# Patient Record
Sex: Female | Born: 1945 | Race: Black or African American | Hispanic: No | Marital: Single | Smoking: Never smoker
Health system: Southern US, Community
[De-identification: ages and names within clinical notes are randomized; demographics above are authoritative.]

## PROBLEM LIST (undated history)

## (undated) DIAGNOSIS — R7303 Prediabetes: Secondary | ICD-10-CM

## (undated) DIAGNOSIS — S43439A Superior glenoid labrum lesion of unspecified shoulder, initial encounter: Secondary | ICD-10-CM

## (undated) DIAGNOSIS — R42 Dizziness and giddiness: Secondary | ICD-10-CM

## (undated) DIAGNOSIS — M51369 Other intervertebral disc degeneration, lumbar region without mention of lumbar back pain or lower extremity pain: Secondary | ICD-10-CM

## (undated) DIAGNOSIS — I10 Essential (primary) hypertension: Secondary | ICD-10-CM

## (undated) DIAGNOSIS — K219 Gastro-esophageal reflux disease without esophagitis: Secondary | ICD-10-CM

## (undated) DIAGNOSIS — I5189 Other ill-defined heart diseases: Secondary | ICD-10-CM

## (undated) DIAGNOSIS — M7581 Other shoulder lesions, right shoulder: Secondary | ICD-10-CM

## (undated) DIAGNOSIS — M810 Age-related osteoporosis without current pathological fracture: Secondary | ICD-10-CM

## (undated) DIAGNOSIS — M5136 Other intervertebral disc degeneration, lumbar region: Secondary | ICD-10-CM

## (undated) DIAGNOSIS — M7521 Bicipital tendinitis, right shoulder: Secondary | ICD-10-CM

## (undated) DIAGNOSIS — H3500 Unspecified background retinopathy: Secondary | ICD-10-CM

## (undated) DIAGNOSIS — H409 Unspecified glaucoma: Secondary | ICD-10-CM

## (undated) DIAGNOSIS — E039 Hypothyroidism, unspecified: Secondary | ICD-10-CM

## (undated) DIAGNOSIS — G43909 Migraine, unspecified, not intractable, without status migrainosus: Secondary | ICD-10-CM

## (undated) DIAGNOSIS — E559 Vitamin D deficiency, unspecified: Secondary | ICD-10-CM

## (undated) DIAGNOSIS — E785 Hyperlipidemia, unspecified: Secondary | ICD-10-CM

## (undated) HISTORY — PX: ABDOMINAL HYSTERECTOMY: SHX81

## (undated) HISTORY — DX: Unspecified background retinopathy: H35.00

## (undated) HISTORY — DX: Gastro-esophageal reflux disease without esophagitis: K21.9

## (undated) HISTORY — DX: Bicipital tendinitis, right shoulder: M75.21

## (undated) HISTORY — DX: Age-related osteoporosis without current pathological fracture: M81.0

## (undated) HISTORY — DX: Other intervertebral disc degeneration, lumbar region: M51.36

## (undated) HISTORY — DX: Migraine, unspecified, not intractable, without status migrainosus: G43.909

## (undated) HISTORY — DX: Other ill-defined heart diseases: I51.89

## (undated) HISTORY — DX: Other intervertebral disc degeneration, lumbar region without mention of lumbar back pain or lower extremity pain: M51.369

## (undated) HISTORY — DX: Other shoulder lesions, right shoulder: M75.81

## (undated) HISTORY — DX: Hyperlipidemia, unspecified: E78.5

## (undated) HISTORY — DX: Prediabetes: R73.03

## (undated) HISTORY — DX: Vitamin D deficiency, unspecified: E55.9

## (undated) HISTORY — DX: Dizziness and giddiness: R42

## (undated) HISTORY — DX: Superior glenoid labrum lesion of unspecified shoulder, initial encounter: S43.439A

## (undated) HISTORY — DX: Unspecified glaucoma: H40.9

## (undated) HISTORY — DX: Hypothyroidism, unspecified: E03.9

## (undated) HISTORY — DX: Essential (primary) hypertension: I10

## (undated) HISTORY — PX: TONSILLECTOMY AND ADENOIDECTOMY: SHX28

---

## 1998-03-21 ENCOUNTER — Other Ambulatory Visit: Admission: RE | Admit: 1998-03-21 | Discharge: 1998-03-21 | Payer: Self-pay | Admitting: Obstetrics and Gynecology

## 1998-08-19 HISTORY — PX: THYROIDECTOMY, PARTIAL: SHX18

## 1999-04-10 ENCOUNTER — Other Ambulatory Visit: Admission: RE | Admit: 1999-04-10 | Discharge: 1999-04-10 | Payer: Self-pay | Admitting: Obstetrics and Gynecology

## 1999-05-25 ENCOUNTER — Encounter: Payer: Self-pay | Admitting: Surgery

## 1999-05-25 ENCOUNTER — Ambulatory Visit (HOSPITAL_COMMUNITY): Admission: RE | Admit: 1999-05-25 | Discharge: 1999-05-25 | Payer: Self-pay | Admitting: Surgery

## 1999-05-25 ENCOUNTER — Encounter (INDEPENDENT_AMBULATORY_CARE_PROVIDER_SITE_OTHER): Payer: Self-pay | Admitting: Specialist

## 2000-04-22 ENCOUNTER — Other Ambulatory Visit: Admission: RE | Admit: 2000-04-22 | Discharge: 2000-04-22 | Payer: Self-pay | Admitting: Obstetrics and Gynecology

## 2000-04-25 ENCOUNTER — Encounter: Payer: Self-pay | Admitting: Surgery

## 2000-04-25 ENCOUNTER — Encounter: Admission: RE | Admit: 2000-04-25 | Discharge: 2000-04-25 | Payer: Self-pay | Admitting: Surgery

## 2001-05-11 ENCOUNTER — Encounter: Admission: RE | Admit: 2001-05-11 | Discharge: 2001-05-11 | Payer: Self-pay | Admitting: Endocrinology

## 2001-05-11 ENCOUNTER — Encounter: Payer: Self-pay | Admitting: Endocrinology

## 2001-05-21 ENCOUNTER — Other Ambulatory Visit: Admission: RE | Admit: 2001-05-21 | Discharge: 2001-05-21 | Payer: Self-pay | Admitting: Obstetrics and Gynecology

## 2002-01-12 ENCOUNTER — Encounter: Payer: Self-pay | Admitting: Internal Medicine

## 2002-01-19 ENCOUNTER — Encounter: Admission: RE | Admit: 2002-01-19 | Discharge: 2002-01-19 | Payer: Self-pay | Admitting: Endocrinology

## 2002-01-19 ENCOUNTER — Encounter: Payer: Self-pay | Admitting: Endocrinology

## 2002-06-23 ENCOUNTER — Other Ambulatory Visit: Admission: RE | Admit: 2002-06-23 | Discharge: 2002-06-23 | Payer: Self-pay | Admitting: Obstetrics and Gynecology

## 2002-07-27 ENCOUNTER — Encounter: Admission: RE | Admit: 2002-07-27 | Discharge: 2002-10-25 | Payer: Self-pay | Admitting: Internal Medicine

## 2002-07-30 ENCOUNTER — Encounter: Payer: Self-pay | Admitting: Endocrinology

## 2002-07-30 ENCOUNTER — Encounter: Admission: RE | Admit: 2002-07-30 | Discharge: 2002-07-30 | Payer: Self-pay | Admitting: Endocrinology

## 2002-10-25 ENCOUNTER — Encounter: Admission: RE | Admit: 2002-10-25 | Discharge: 2003-01-23 | Payer: Self-pay | Admitting: Internal Medicine

## 2002-10-28 ENCOUNTER — Encounter: Payer: Self-pay | Admitting: Internal Medicine

## 2002-11-01 ENCOUNTER — Ambulatory Visit (HOSPITAL_COMMUNITY): Admission: RE | Admit: 2002-11-01 | Discharge: 2002-11-01 | Payer: Self-pay | Admitting: Internal Medicine

## 2002-11-01 ENCOUNTER — Encounter: Payer: Self-pay | Admitting: Internal Medicine

## 2003-02-01 ENCOUNTER — Encounter: Admission: RE | Admit: 2003-02-01 | Discharge: 2003-05-02 | Payer: Self-pay | Admitting: Internal Medicine

## 2003-04-15 ENCOUNTER — Encounter: Payer: Self-pay | Admitting: Endocrinology

## 2003-04-15 ENCOUNTER — Encounter: Admission: RE | Admit: 2003-04-15 | Discharge: 2003-04-15 | Payer: Self-pay | Admitting: Endocrinology

## 2003-07-21 ENCOUNTER — Other Ambulatory Visit: Admission: RE | Admit: 2003-07-21 | Discharge: 2003-07-21 | Payer: Self-pay | Admitting: Obstetrics and Gynecology

## 2003-11-07 ENCOUNTER — Encounter (INDEPENDENT_AMBULATORY_CARE_PROVIDER_SITE_OTHER): Payer: Self-pay | Admitting: *Deleted

## 2003-11-07 ENCOUNTER — Inpatient Hospital Stay (HOSPITAL_COMMUNITY): Admission: RE | Admit: 2003-11-07 | Discharge: 2003-11-08 | Payer: Self-pay | Admitting: Surgery

## 2004-09-11 ENCOUNTER — Encounter: Admission: RE | Admit: 2004-09-11 | Discharge: 2004-09-11 | Payer: Self-pay | Admitting: Otolaryngology

## 2004-09-24 ENCOUNTER — Ambulatory Visit: Payer: Self-pay | Admitting: Internal Medicine

## 2005-04-15 ENCOUNTER — Ambulatory Visit: Payer: Self-pay | Admitting: Internal Medicine

## 2005-07-10 ENCOUNTER — Encounter: Admission: RE | Admit: 2005-07-10 | Discharge: 2005-07-10 | Payer: Self-pay | Admitting: Internal Medicine

## 2008-03-18 ENCOUNTER — Telehealth: Payer: Self-pay | Admitting: Internal Medicine

## 2008-03-21 ENCOUNTER — Ambulatory Visit: Payer: Self-pay | Admitting: Gastroenterology

## 2008-03-21 DIAGNOSIS — J309 Allergic rhinitis, unspecified: Secondary | ICD-10-CM | POA: Insufficient documentation

## 2008-03-21 DIAGNOSIS — E039 Hypothyroidism, unspecified: Secondary | ICD-10-CM | POA: Insufficient documentation

## 2008-03-21 DIAGNOSIS — I10 Essential (primary) hypertension: Secondary | ICD-10-CM | POA: Insufficient documentation

## 2008-03-21 DIAGNOSIS — K219 Gastro-esophageal reflux disease without esophagitis: Secondary | ICD-10-CM | POA: Insufficient documentation

## 2008-03-21 DIAGNOSIS — K224 Dyskinesia of esophagus: Secondary | ICD-10-CM

## 2008-03-24 ENCOUNTER — Ambulatory Visit (HOSPITAL_COMMUNITY): Admission: RE | Admit: 2008-03-24 | Discharge: 2008-03-24 | Payer: Self-pay | Admitting: Internal Medicine

## 2009-10-24 ENCOUNTER — Ambulatory Visit (HOSPITAL_COMMUNITY): Admission: RE | Admit: 2009-10-24 | Discharge: 2009-10-24 | Payer: Self-pay | Admitting: Obstetrics and Gynecology

## 2010-02-28 ENCOUNTER — Encounter: Admission: RE | Admit: 2010-02-28 | Discharge: 2010-02-28 | Payer: Self-pay | Admitting: Orthopedic Surgery

## 2010-10-29 ENCOUNTER — Other Ambulatory Visit (HOSPITAL_COMMUNITY): Payer: Self-pay | Admitting: Obstetrics and Gynecology

## 2010-10-29 DIAGNOSIS — E041 Nontoxic single thyroid nodule: Secondary | ICD-10-CM

## 2010-10-31 ENCOUNTER — Other Ambulatory Visit (HOSPITAL_COMMUNITY): Payer: Self-pay

## 2010-11-07 ENCOUNTER — Ambulatory Visit (HOSPITAL_COMMUNITY)
Admission: RE | Admit: 2010-11-07 | Discharge: 2010-11-07 | Disposition: A | Discharge status: Home / Self Care | Payer: BC Managed Care – PPO | Source: Ambulatory Visit | Attending: Obstetrics and Gynecology | Admitting: Obstetrics and Gynecology

## 2010-11-07 DIAGNOSIS — E041 Nontoxic single thyroid nodule: Secondary | ICD-10-CM

## 2010-11-07 DIAGNOSIS — E0789 Other specified disorders of thyroid: Secondary | ICD-10-CM | POA: Insufficient documentation

## 2010-11-07 DIAGNOSIS — E042 Nontoxic multinodular goiter: Secondary | ICD-10-CM | POA: Insufficient documentation

## 2011-01-04 NOTE — Op Note (Signed)
Tammy Robertson, Tammy Robertson                          ACCOUNT NO.:  [id number]   MEDICAL RECORD NO.:  [id number]                   PATIENT TYPE:  INP   LOCATION:  2899                                 FACILITY:  [facility]   PHYSICIAN:  Sandria Bales. Ezzard Standing, M.D.               DATE OF BIRTH:  [birth year]   DATE OF PROCEDURE:  11/07/2003  DATE OF DISCHARGE:                                 OPERATIVE REPORT   PREOPERATIVE DIAGNOSIS:  Multinodular goiter with left lobe extending into  superior mediastinum.   POSTOPERATIVE DIAGNOSIS:  Multinodular goiter with the left lower lobe  extending into the anterior mediastinum and the left lobe actually two  separate lobes.   PROCEDURE:  Left thyroid lobectomy with removal of the extension of the  mediastinal thyroid.   SURGEON:  Sandria Bales. Ezzard Standing, M.D.   FIRST ASSISTANT:  Ines Bloomer, M.D.   ANESTHESIA:  General endotracheal.   ESTIMATED BLOOD LOSS:  50 mL.   DRAINS LEFT IN:  None.   INDICATION FOR PROCEDURE:  Tammy Robertson is a 65 year old black female who has  had a multinodular goiter for a number of years and had been on thyroid  suppression.  A recent CT scan suggested a superior anterior mediastinal  mass consistent with extension of the left thyroid gland.   The patient now comes for attempted left thyroid lobectomy and removal of  this mediastinal mass.  The patient understands first this mediastinal mass  could be something other than thyroid tissue; secondary, that if we cannot  get it out through a neck incision she may need a median sternotomy, and  therefore Ines Bloomer, M.D., has agreed to scrub with me.   The potential complications include but are not limited to bleeding,  infection, parathyroid injury, though I told her we would stay to just one  gland unless there is some evidence of malignancy, and recurrent laryngeal  nerve injury which the nerve may be distorted or splayed by her thyroid  gland.   The patient brought to the  operating room and underwent a general  endotracheal anesthesia supervised by Bedelia Person, M.D.  Her arms were tucked  to her sides.  She had a left arterial line and Foley catheter in place.  Her upper neck and chest were prepped with Betadine solution and sterilely  draped.   A transverse neck incision was made approximately 2 cm above the  suprasternal notch.  The skin flaps were elevated superiorly to the cricoid  cartilage and inferiorly to the superior end of the sternum.  I then divided  the strap muscles and started dissection of the left thyroid lobe.   The strap muscles were retracted laterally.  We tried to free up the thyroid  gland.  What was interesting is that it appeared that the left thyroid gland  was actually two separate lobes.  There was an upper lobe which extended  fairly far up the left neck; then there was a space of about 1 cm and then  the lower lobe, and this is what extended down into the mediastinum.   I was able to mobilize up the gland both by Bovie electrocautery, by blunt  dissection and finger dissection, to get this thyroid out of the mediastinum  and pull this up into the neck wound.   The inferior thyroid vessels were clipped with medium clips or tied with  silk suture.  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  It was being  splayed sort of lateral and posterior, and we went on and removed this lower  pole separately and actually, once we got it out of the superior portion of  the mediastinum, it actually came out fairly though it had a lot of  distorted adventitial tissue around it.   Again, the upper lobe appeared to be a separate pole.  This was much more  stuck to the strap muscles.  I went up to the upper pole vessels.  I both  tied them with a 2-0 silk suture and clipped them.  There were two branches.  I then retracted this down, and again it had a lot of attachments to the  trachea and tracheal groove and to the vessels.   Again, it  appeared the recurrent laryngeal nerve went posterior to these  attachments and I was able to rotate this thyroid gland medially and resect  it.  I did send it as a separate specimen.  The estimated size of the upper  thyroid was maybe 3 x 5 cm, of the lower thyroid was probably about 4 x 12  cm, and these were sent as separate specimens.  Again, I did not do a frozen  because they had a benign appearance.  It looked really like a multinodular  gland.  On the right thyroid gland she had one or two prominent nodules.  Again I think I did identify a superior left parathyroid gland but I never  saw an inferior parathyroid gland.  I did identify the recurrent laryngeal  nerve.  We irrigated the wound.  My primary method of ligating vessels was  either with small clips, medium clips, or silk ties.  Once this was  complete, we then irrigated out the wound.  I placed Surgicel up in the  upper pole bed and the lateral bed.  I then closed the strap muscles in  interrupted 3-0 Vicryl suture.  I closed the platysma with 3-0 Vicryl  sutures and closed the skin with a 5-0 Vicryl suture.   The patient tolerated the procedure well, was transported to the recovery  room in good condition.  Sponge and needle count were correct at the end of  the case.                                               Sandria Bales. Ezzard Standing, M.D.    DHN/MEDQ  D:  11/07/2003  T:  11/08/2003  Job:  [id number]   cc:   Merlene Laughter. Renae Gloss, M.D.  [address]  [place]  [place] [address]  Fax: [phone]   Ines Bloomer, M.D.  [address]  [place]  [place] [address]   Brooke Bonito, M.D.  [address]  [place]  [place] [address]  Fax:  [phone] 

## 2011-10-29 ENCOUNTER — Encounter: Payer: Self-pay | Admitting: Internal Medicine

## 2011-11-04 ENCOUNTER — Ambulatory Visit (INDEPENDENT_AMBULATORY_CARE_PROVIDER_SITE_OTHER): Payer: BC Managed Care – PPO | Admitting: Family Medicine

## 2011-11-04 VITALS — BP 134/73 | HR 99 | Temp 98.2°F | Resp 16 | Ht 63.0 in | Wt 151.0 lb

## 2011-11-04 DIAGNOSIS — H6120 Impacted cerumen, unspecified ear: Secondary | ICD-10-CM

## 2011-11-04 DIAGNOSIS — R112 Nausea with vomiting, unspecified: Secondary | ICD-10-CM

## 2011-11-04 LAB — POCT CBC
Granulocyte percent: 69.4 %G (ref 37–80)
HCT, POC: 43 % (ref 37.7–47.9)
Hemoglobin: 13.9 g/dL (ref 12.2–16.2)
Lymph, poc: 1.8 (ref 0.6–3.4)
MCH, POC: 28.9 pg (ref 27–31.2)
MCHC: 32.3 g/dL (ref 31.8–35.4)
MCV: 89.5 fL (ref 80–97)
MID (cbc): 0.5 (ref 0–0.9)
MPV: 10.9 fL (ref 0–99.8)
POC Granulocyte: 5.1 (ref 2–6.9)
POC LYMPH PERCENT: 24.3 %L (ref 10–50)
POC MID %: 6.3 %M (ref 0–12)
Platelet Count, POC: 209 10*3/uL (ref 142–424)
RBC: 4.81 M/uL (ref 4.04–5.48)
RDW, POC: 13.6 %
WBC: 7.3 10*3/uL (ref 4.6–10.2)

## 2011-11-04 MED ORDER — ONDANSETRON HCL 8 MG PO TABS: 8.0000 mg | ORAL_TABLET | Freq: Three times a day (TID) | ORAL | Status: AC | PRN

## 2011-11-04 NOTE — Progress Notes (Signed)
Patient Name: Tammy Robertson Date of Birth: December 09, 1945 Medical Record Number: [id number] Gender: female Date of Encounter: 11/04/2011  History of Present Illness:  Tammy Robertson is a 66 y.o. very pleasant female patient who presents with the following:  About 3 weeks ago she noted the onset of symptoms- woke up with flu- like symptoms, aches, scratchy throat and fatigue.  Did not feel terrible, a couple of days later felt better.  However, she then developed HA, productive cough, more muscle aches, diarrhea- these symptoms waxed and waned, she finally felt better over the past week.  On Saturday she went out to a dinner dance- the next day she felt nauseated, around 3am this morning she had vomiting a couple of times and had a little diarrhea.  Did have a fever yesterday of 100.8. She still notes aches- no cough.    Patient Active Problem List  Diagnoses  . HYPOTHYROIDISM  . HYPERTENSION  . ALLERGIC RHINITIS  . ESOPHAGEAL MOTILITY DISORDER  . GERD   Past Medical History  Diagnosis Date  . GERD (gastroesophageal reflux disease)    Past Surgical History  Procedure Date  . Tonsillectomy and adenoidectomy   . Abdominal hysterectomy   . Thyroidectomy, partial 2000    due to goiter   History  Substance Use Topics  . Smoking status: Never Smoker   . Smokeless tobacco: Never Used  . Alcohol Use: 0.0 oz/week     occassional drinker   History reviewed. No pertinent family history. Allergies  Allergen Reactions  . Codeine     Medication list has been reviewed and updated.  Review of Systems: As per HPI- otherwise negative. Also notes that her right ear feels "clogged"  Physical Examination: Filed Vitals:   11/04/11 0958  BP: 134/73  Pulse: 99  Temp: 98.2 F (36.8 C)  TempSrc: Oral  Resp: 16  Height: 5\' 3"  (1.6 m)  Weight: 151 lb (68.493 kg)    Body mass index is 26.75 kg/(m^2).  GEN: WDWN, NAD, Non-toxic, A & O x 3, overweight- looks well HEENT: Atraumatic,  Normocephalic. Neck supple. No masses, No LAD.  Left TM wnl, right TM blocked by cerumen- after irrigation Tm clear Ears and Nose: No external deformity. CV: RRR, No M/G/R. No JVD. No thrill. No extra heart sounds. PULM: CTA B, no wheezes, crackles, rhonchi. No retractions. No resp. distress. No accessory muscle use. ABD: S, ND, +BS. No rebound. No HSM. Minimal tenderness that is diffuse throughout the abdomen- nothing focal EXTR: No c/c/e NEURO Normal gait.  PSYCH: Normally interactive. Conversant. Not depressed or anxious appearing.  Calm demeanor.   Results for orders placed in visit on 11/04/11  POCT CBC      Component Value Range   WBC 7.3  4.6 - 10.2 (K/uL)   Lymph, poc 1.8  0.6 - 3.4    POC LYMPH PERCENT 24.3  10 - 50 (%L)   MID (cbc) 0.5  0 - 0.9    POC MID % 6.3  0 - 12 (%M)   POC Granulocyte 5.1  2 - 6.9    Granulocyte percent 69.4  37 - 80 (%G)   RBC 4.81  4.04 - 5.48 (M/uL)   Hemoglobin 13.9  12.2 - 16.2 (g/dL)   HCT, POC 46.9  62.9 - 47.9 (%)   MCV 89.5  80 - 97 (fL)   MCH, POC 28.9  27 - 31.2 (pg)   MCHC 32.3  31.8 - 35.4 (g/dL)   RDW, POC  13.6     Platelet Count, POC 209  142 - 424 (K/uL)   MPV 10.9  0 - 99.8 (fL)   Assessment and Plan: 1. Cerumen impaction    2. Nausea and vomiting  POCT CBC, ondansetron (ZOFRAN) 8 MG tablet   Resolved cerumen impaction with irrigation.  zofran as needed for likely viral gastroenteritis.  Bland diet, plenty of fluids- Patient (or parent if minor) instructed to return to clinic or call if not better in 2 day(s). Sooner if worse.  Discussed possibility of another cause for abdominal symptoms so she will be sure to follow- up if she is not bette soon.

## 2011-12-26 ENCOUNTER — Telehealth (INDEPENDENT_AMBULATORY_CARE_PROVIDER_SITE_OTHER): Payer: Self-pay | Admitting: Surgery

## 2011-12-26 ENCOUNTER — Ambulatory Visit (INDEPENDENT_AMBULATORY_CARE_PROVIDER_SITE_OTHER): Payer: BC Managed Care – PPO | Admitting: Surgery

## 2011-12-26 ENCOUNTER — Encounter (INDEPENDENT_AMBULATORY_CARE_PROVIDER_SITE_OTHER): Payer: Self-pay | Admitting: Surgery

## 2011-12-26 VITALS — BP 134/84 | HR 70 | Temp 98.0°F | Ht 64.0 in | Wt 152.8 lb

## 2011-12-26 DIAGNOSIS — E041 Nontoxic single thyroid nodule: Secondary | ICD-10-CM

## 2011-12-26 DIAGNOSIS — E042 Nontoxic multinodular goiter: Secondary | ICD-10-CM

## 2011-12-26 NOTE — Telephone Encounter (Signed)
I returned her call

## 2011-12-26 NOTE — Progress Notes (Addendum)
[facility]  Ovidio Kin, MD,  FACS [address]  [address] [place], [place]    [address] Phone:  [phone] FAX:  [phone]   Re:   MANASI DISHON DOB:   Aug 10, 1946 MRN:   [id number]  ASSESSMENT AND PLAN: 1.  Multinodular thyroid, right lobe  History of left thyroid lobectomy 11/07/2003 for benign disease.  Dr. Alessandra Bevels is adjusting her thyroid meds.  We'll get her labs.  We'll plan a repeat neck US to check nodules.  I told her I would speak to her on the phone about the findings.  If stable, I will see her back in one year.  [US on 516/2013 shows nodules in right lobe unchanged.  I talked to her on the phone. I will see her back in one year. DN  01/07/2012]   2.  History of right breast nodule.  No further problems.  HISTORY OF PRESENT ILLNESS: Chief Complaint  Patient presents with  . Follow-up    yrly thyroid ck    MARDELLE PANDOLFI is a 66 y.o. (DOB: 1946/05/09)  AA female who is a patient of VAUGHAN,ELIZABETH, MD, MD and comes to me today for thyroid and breast disease.  She sees Dr. Gaye Alken from GYN standpoint.  She has done well.  She has no neck symptoms or complaints.  Dr. Alessandra Bevels is adjusting her dosage to her thyroid meds.  We'll try to get her labs.  Last [place] 10/29/2010 - 2.5 cm inferior nodule and 2.0 superior nodule. She has been on Liotrix (T3/T4). TSH - 0.015 on 12/05/2010.  PHYSICAL EXAM: BP 134/84  Pulse 70  Temp(Src) 98 F (36.7 C) (Temporal)  Ht 5\' 4"  (1.626 m)  Wt 152 lb 12.8 oz (69.31 kg)  BMI 26.23 kg/m2  SpO2 96%  Neck:  Well healed neck scar.  A right neck fullness, but no specific nodule or mass.  I feel no adenopathy. Lungs:  Clear and symmetric. Heart:  RRR  DATA REVIEWED: Old [place] and labs.  Ovidio Kin, MD, FACS Office:  [phone]

## 2012-01-02 ENCOUNTER — Ambulatory Visit
Admission: RE | Admit: 2012-01-02 | Discharge: 2012-01-02 | Disposition: A | Discharge status: Home / Self Care | Payer: BC Managed Care – PPO | Source: Ambulatory Visit | Attending: Surgery | Admitting: Surgery

## 2012-01-02 DIAGNOSIS — E042 Nontoxic multinodular goiter: Secondary | ICD-10-CM

## 2012-01-06 ENCOUNTER — Telehealth (INDEPENDENT_AMBULATORY_CARE_PROVIDER_SITE_OTHER): Payer: Self-pay

## 2012-01-06 NOTE — Telephone Encounter (Signed)
I called and requested the latest thyroid lab results.  They will fax them.

## 2012-03-16 ENCOUNTER — Encounter: Payer: Self-pay | Admitting: Internal Medicine

## 2012-04-15 ENCOUNTER — Ambulatory Visit (INDEPENDENT_AMBULATORY_CARE_PROVIDER_SITE_OTHER): Payer: BC Managed Care – PPO | Admitting: Family Medicine

## 2012-04-15 VITALS — BP 124/72 | HR 84 | Temp 97.9°F | Resp 16 | Ht 63.0 in | Wt 154.0 lb

## 2012-04-15 DIAGNOSIS — R05 Cough: Secondary | ICD-10-CM

## 2012-04-15 DIAGNOSIS — J40 Bronchitis, not specified as acute or chronic: Secondary | ICD-10-CM

## 2012-04-15 MED ORDER — AZITHROMYCIN 250 MG PO TABS: ORAL_TABLET | ORAL | Status: AC

## 2012-04-15 MED ORDER — ALBUTEROL SULFATE HFA 108 (90 BASE) MCG/ACT IN AERS: 2.0000 | INHALATION_SPRAY | Freq: Four times a day (QID) | RESPIRATORY_TRACT | Status: DC | PRN

## 2012-04-15 MED ORDER — BENZONATATE 100 MG PO CAPS: 100.0000 mg | ORAL_CAPSULE | Freq: Three times a day (TID) | ORAL | Status: AC | PRN

## 2012-04-15 NOTE — Progress Notes (Signed)
[facility] [address] [place], [place] [phone]   Date:  04/15/2012   Name:  Tammy Robertson   DOB:  1946/07/22   MRN:  [id number] Gender: female Age: 66 y.o.  PCP:  Mia Creek, MD    Chief Complaint: Cough and Wheezing   History of Present Illness:  Tammy Robertson is a 66 y.o. pleasant patient who presents with the following:  Seen here in March of this year with viral gastroenteritis and cerumen impaction.  She is here today with a cold that does not seem to go away.  She is having cough attacks that cause her to gag.  Runny nose.  She has been ill overall for about 10 days, but the more severe cough has been present for 2 or 3 days.    She did have a fever earlier in the illness, but his is resolved.  Feels tired, did have chills but these are resolved.   She is coughing up thick, pale yellow mucus.   No ST, no earache.  No GI symptoms she did have diarrhea but this cleared up.  She has tried mucinex DM, advil cold and sinus.    She has noted wheezing- she does not typically wheeze .  Patient Active Problem List  Diagnosis  . HYPOTHYROIDISM  . HYPERTENSION  . ALLERGIC RHINITIS  . ESOPHAGEAL MOTILITY DISORDER  . GERD  . Thyroid nodule, right.    Past Medical History  Diagnosis Date  . GERD (gastroesophageal reflux disease)   . Osteoporosis   . Thyroid disease     Past Surgical History  Procedure Date  . Tonsillectomy and adenoidectomy   . Abdominal hysterectomy   . Thyroidectomy, partial 2000    due to goiter    History  Substance Use Topics  . Smoking status: Never Smoker   . Smokeless tobacco: Never Used  . Alcohol Use: 0.0 oz/week     occassional drinker    Family History  Problem Relation Age of Onset  . Cancer Mother     uterine  . Stroke Father     Allergies  Allergen Reactions  . Codeine     Medication list has been reviewed and updated.  Current Outpatient Prescriptions on File Prior to Visit  Medication Sig Dispense Refill    . Ascorbic Acid 500 MG WAFR Take by mouth.      . cholecalciferol (VITAMIN D) 1000 UNITS tablet Take 10,000 Units by mouth every other day.      . Glutamine POWD Take by mouth.      . IODINE, KELP, PO Take by mouth.      Marland Kitchen LIOTRIX, T3-T4, PO Take by mouth daily. Prescribed by Dr. Allena Napoleon      . Multiple Vitamins-Minerals (MULTIVITAMIN WITH MINERALS) tablet Take 1 tablet by mouth daily.      . Red Yeast Rice Extract (RED YEAST RICE PO) Take by mouth.      . vitamin A 08657 UNIT capsule Take 25,000 Units by mouth once a week.      . vitamin B-12 (CYANOCOBALAMIN) 100 MCG tablet Take 100 mcg by mouth daily.      . vitamin k 100 MCG tablet Take 100 mcg by mouth daily.        Review of Systems:  As per HPI- otherwise negative.   Physical Examination: Filed Vitals:   04/15/12 1000  BP: 124/72  Pulse: 84  Temp: 97.9 F (36.6 C)  Resp: 16   Filed Vitals:  04/15/12 1000  Height: 5\' 3"  (1.6 m)  Weight: 154 lb (69.854 kg)   Body mass index is 27.28 kg/(m^2). Ideal Body Weight: Weight in (lb) to have BMI = 25: 140.8   GEN: WDWN, NAD, Non-toxic, A & O x 3 HEENT: Atraumatic, Normocephalic. Neck supple. No masses, No LAD.  TM and oropharynx wnl, PEERL, EOMI Ears and Nose: No external deformity. CV: RRR, No M/G/R. No JVD. No thrill. No extra heart sounds. PULM: good air movement, but occasional rhonchi and wheezes bilateraly. No retractions. No resp. distress. No accessory muscle use. ABD: S, NT, ND EXTR: No c/c/e NEURO Normal gait.  PSYCH: Normally interactive. Conversant. Not depressed or anxious appearing.  Calm demeanor.    Assessment and Plan: 1. Bronchitis  azithromycin (ZITHROMAX) 250 MG tablet  2. Cough  benzonatate (TESSALON) 100 MG capsule, albuterol (PROVENTIL HFA;VENTOLIN HFA) 108 (90 BASE) MCG/ACT inhaler   Treat for bronchitis as above.  She has not had a Tdap- reminded her to get this shot and a flu shot this fall, rx for zostavax. Patient (or parent if  minor) instructed to return to clinic or call if not better in 2-3 day(s).  Sooner if worse.      Abbe Amsterdam, MD

## 2012-04-30 ENCOUNTER — Ambulatory Visit (INDEPENDENT_AMBULATORY_CARE_PROVIDER_SITE_OTHER): Payer: BC Managed Care – PPO | Admitting: Family Medicine

## 2012-04-30 ENCOUNTER — Ambulatory Visit: Payer: BC Managed Care – PPO

## 2012-04-30 ENCOUNTER — Telehealth: Payer: Self-pay | Admitting: *Deleted

## 2012-04-30 VITALS — BP 138/72 | HR 100 | Temp 98.2°F | Resp 16 | Ht 64.0 in | Wt 153.0 lb

## 2012-04-30 DIAGNOSIS — R05 Cough: Secondary | ICD-10-CM

## 2012-04-30 DIAGNOSIS — J309 Allergic rhinitis, unspecified: Secondary | ICD-10-CM

## 2012-04-30 MED ORDER — AZELASTINE HCL 0.1 % NA SOLN: 1.0000 | Freq: Two times a day (BID) | NASAL | Status: AC

## 2012-04-30 MED ORDER — HYDROCODONE-HOMATROPINE 5-1.5 MG/5ML PO SYRP: 5.0000 mL | ORAL_SOLUTION | Freq: Three times a day (TID) | ORAL | Status: AC | PRN

## 2012-04-30 MED ORDER — ALBUTEROL SULFATE 1.25 MG/3ML IN NEBU: 1.0000 | INHALATION_SOLUTION | Freq: Four times a day (QID) | RESPIRATORY_TRACT | Status: DC | PRN

## 2012-04-30 NOTE — Progress Notes (Signed)
[facility] [address] [place] [address] [phone]  Date:  04/30/2012   Name:  Tammy Robertson   DOB:  [birth year]   MRN:  [id number]  PCP:  Mia Creek, MD    Chief Complaint: Cough, Sore Throat and Headache   History of Present Illness:  Tammy Robertson is a 66 y.o. very pleasant female patient who presents with the following:  She was here about 2 weeks ago with a cough- she had noted symptoms of a cough and cold at that time.  She was treated with a zpack for bronchitis and did improve. However, her cough never completely went away.  Then 2 days ago her cough worsened again. It is mostly dry, and she is having some cough attacks, mostly in the morning.  Occasionally she will cough up mucus.  Some sneezing but no other nasal symptoms, she has noted a mild ST.  She also notes some pressure and frontal HA.   The inhaler does help with her cough.    She has not noted a fever, but has felt tired.    She also fell onto her right knee about one week ago.  She notes some numbness near a bruise on her knee.   Sachet does have a history of AR.  She is not using any medication at this time She also has used astelin in the past which was helpful.  She also has used zyrtec in the past which seemed to help.    She is not actually allergic to codeine- it has caused nausea in the past but never had any rash, swelling, or other true allergic reaction.   Patient Active Problem List  Diagnosis  . HYPOTHYROIDISM  . HYPERTENSION  . ALLERGIC RHINITIS  . ESOPHAGEAL MOTILITY DISORDER  . GERD  . Thyroid nodule, right.    Past Medical History  Diagnosis Date  . GERD (gastroesophageal reflux disease)   . Osteoporosis   . Thyroid disease     Past Surgical History  Procedure Date  . Tonsillectomy and adenoidectomy   . Abdominal hysterectomy   . Thyroidectomy, partial 2000    due to goiter    History  Substance Use Topics  . Smoking status: Never Smoker     . Smokeless tobacco: Never Used  . Alcohol Use: 0.0 oz/week     occassional drinker    Family History  Problem Relation Age of Onset  . Cancer Mother     uterine  . Stroke Father     Allergies  Allergen Reactions  . Codeine     Medication list has been reviewed and updated.  Current Outpatient Prescriptions on File Prior to Visit  Medication Sig Dispense Refill  . albuterol (PROVENTIL HFA;VENTOLIN HFA) 108 (90 BASE) MCG/ACT inhaler Inhale 2 puffs into the lungs every 6 (six) hours as needed for wheezing.  1 Inhaler  0  . Ascorbic Acid 500 MG WAFR Take by mouth.      . cholecalciferol (VITAMIN D) 1000 UNITS tablet Take 10,000 Units by mouth every other day.      . Glutamine POWD Take by mouth.      . IODINE, KELP, PO Take by mouth.      Marland Kitchen LIOTRIX, T3-T4, PO Take by mouth daily. Prescribed by Dr. Allena Napoleon      . Multiple Vitamins-Minerals (MULTIVITAMIN WITH MINERALS) tablet Take 1 tablet by mouth daily.      . Pseudoephedrine-Ibuprofen (ADVIL COLD & SINUS LIQUI-GELS  PO) Take by mouth.      . Red Yeast Rice Extract (RED YEAST RICE PO) Take by mouth.      . vitamin A 78295 UNIT capsule Take 25,000 Units by mouth once a week.      . vitamin B-12 (CYANOCOBALAMIN) 100 MCG tablet Take 100 mcg by mouth daily.      . vitamin k 100 MCG tablet Take 100 mcg by mouth daily.      Marland Kitchen dextromethorphan-guaiFENesin (MUCINEX DM) 30-600 MG per 12 hr tablet Take 1 tablet by mouth every 12 (twelve) hours.        Review of Systems:  As per HPI- otherwise negative.   Physical Examination: Filed Vitals:   04/30/12 1005  BP: 138/72  Pulse: 100  Temp: 98.2 F (36.8 C)  Resp: 16   Filed Vitals:   04/30/12 1005  Height: 5\' 4"  (1.626 m)  Weight: 153 lb (69.4 kg)   Body mass index is 26.26 kg/(m^2). Ideal Body Weight: Weight in (lb) to have BMI = 25: 145.3   GEN: WDWN, NAD, Non-toxic, A & O x 3, looks well HEENT: Atraumatic, Normocephalic. Neck supple. No masses, No LAD.  TM and  oropharynx wnl.    PEERL, EOMI Ears and Nose: No external deformity. CV: RRR, No M/G/R. No JVD. No thrill. No extra heart sounds. PULM: CTA B, no wheezes, crackles, rhonchi. No retractions. No resp. distress. No accessory muscle use. Coughing some in room.   ABD: S, NT, ND, +BS. No rebound. No HSM. EXTR: No c/c/e NEURO Normal gait.  PSYCH: Normally interactive. Conversant. Not depressed or anxious appearing.  Calm demeanor.   [facility] reading (PRIMARY) by  Dr. Patsy Lager.  No active disease  CHEST - 2 VIEW  Comparison: None.  Findings: Cardiomediastinal silhouette is within normal limits. The lungs are free of focal consolidations and pleural effusions. Surgical clips are identified in the left superior mediastinum.  IMPRESSION: No evidence for acute cardiopulmonary abnormality.  Clinically significant discrepancy from primary report, if provided: None  Assessment and Plan: 1. Cough  DG Chest 2 View, HYDROcodone-homatropine (HYCODAN) 5-1.5 MG/5ML syrup, DISCONTINUED: albuterol (ACCUNEB) 1.25 MG/3ML nebulizer solution  2. Allergic rhinitis  azelastine (ASTELIN) 137 MCG/SPRAY nasal spray   Suspect that Tacy has allergies that are contributing to her cough.  Will start her back on astelin, and she will restart zyrtec.  Also gave her some hycodan syrup to use as needed as she is not actually allergic to codeine.  She will let me know if not better in the next few days- Sooner if worse.     Abbe Amsterdam, MD

## 2012-04-30 NOTE — Telephone Encounter (Signed)
Per Dr. Patsy Lager, called patient at both her home and cell numbers left message saying chest xray report from radiologist was clear.  No pneumonia, no infection.  Patient was instructed to call with questions or concerns. Carollee Leitz, CMA.

## 2012-05-01 ENCOUNTER — Ambulatory Visit (AMBULATORY_SURGERY_CENTER): Payer: BC Managed Care – PPO

## 2012-05-01 VITALS — Ht 64.0 in | Wt 154.0 lb

## 2012-05-01 DIAGNOSIS — Z1211 Encounter for screening for malignant neoplasm of colon: Secondary | ICD-10-CM

## 2012-05-01 MED ORDER — MOVIPREP 100 G PO SOLR: 1.0000 | Freq: Once | ORAL | Status: DC

## 2012-05-04 ENCOUNTER — Encounter: Payer: Self-pay | Admitting: Internal Medicine

## 2012-05-11 ENCOUNTER — Encounter: Payer: Self-pay | Admitting: Internal Medicine

## 2012-05-15 ENCOUNTER — Encounter: Payer: BC Managed Care – PPO | Admitting: Internal Medicine

## 2012-06-07 ENCOUNTER — Encounter: Payer: Self-pay | Admitting: Family Medicine

## 2012-06-26 ENCOUNTER — Encounter: Payer: Self-pay | Admitting: Internal Medicine

## 2012-06-26 ENCOUNTER — Ambulatory Visit (AMBULATORY_SURGERY_CENTER): Payer: BC Managed Care – PPO

## 2012-06-26 VITALS — Ht 64.0 in | Wt 159.7 lb

## 2012-06-26 DIAGNOSIS — Z1211 Encounter for screening for malignant neoplasm of colon: Secondary | ICD-10-CM

## 2012-06-26 MED ORDER — MOVIPREP 100 G PO SOLR: 1.0000 | Freq: Once | ORAL | Status: DC

## 2012-07-10 ENCOUNTER — Ambulatory Visit (AMBULATORY_SURGERY_CENTER): Payer: BC Managed Care – PPO | Admitting: Internal Medicine

## 2012-07-10 ENCOUNTER — Encounter: Payer: BC Managed Care – PPO | Admitting: Internal Medicine

## 2012-07-10 ENCOUNTER — Encounter: Payer: Self-pay | Admitting: Internal Medicine

## 2012-07-10 VITALS — BP 132/88 | HR 74 | Temp 99.6°F | Resp 21 | Ht 64.0 in | Wt 154.0 lb

## 2012-07-10 DIAGNOSIS — Z1211 Encounter for screening for malignant neoplasm of colon: Secondary | ICD-10-CM

## 2012-07-10 MED ORDER — HYDROCORTISONE ACETATE 25 MG RE SUPP: 25.0000 mg | Freq: Every evening | RECTAL | Status: DC | PRN

## 2012-07-10 MED ORDER — SODIUM CHLORIDE 0.9 % IV SOLN: 500.0000 mL | INTRAVENOUS | Status: DC

## 2012-07-10 NOTE — Patient Instructions (Signed)
YOU HAD AN ENDOSCOPIC PROCEDURE TODAY AT THE [facility]: Refer to the procedure report that was given to you for any specific questions about what was found during the examination.  If the procedure report does not answer your questions, please call your gastroenterologist to clarify.  If you requested that your care partner not be given the details of your procedure findings, then the procedure report has been included in a sealed envelope for you to review at your convenience later.  YOU SHOULD EXPECT: Some feelings of bloating in the abdomen. Passage of more gas than usual.  Walking can help get rid of the air that was put into your GI tract during the procedure and reduce the bloating. If you had a lower endoscopy (such as a colonoscopy or flexible sigmoidoscopy) you may notice spotting of blood in your stool or on the toilet paper. If you underwent a bowel prep for your procedure, then you may not have a normal bowel movement for a few days.  DIET: Your first meal following the procedure should be a light meal and then it is ok to progress to your normal diet.  A half-sandwich or bowl of soup is an example of a good first meal.  Heavy or fried foods are harder to digest and may make you feel nauseous or bloated.  Likewise meals heavy in dairy and vegetables can cause extra gas to form and this can also increase the bloating.  Drink plenty of fluids but you should avoid alcoholic beverages for 24 hours.  ACTIVITY: Your care partner should take you home directly after the procedure.  You should plan to take it easy, moving slowly for the rest of the day.  You can resume normal activity the day after the procedure however you should NOT DRIVE or use heavy machinery for 24 hours (because of the sedation medicines used during the test).    SYMPTOMS TO REPORT IMMEDIATELY: A gastroenterologist can be reached at any hour.  During normal business hours, 8:30 AM to 5:00 PM Monday through Friday,  call [phone].  After hours and on weekends, please call the GI answering service at [phone] who will take a message and have the physician on call contact you.   Following lower endoscopy (colonoscopy or flexible sigmoidoscopy):  Excessive amounts of blood in the stool  Significant tenderness or worsening of abdominal pains  Swelling of the abdomen that is new, acute  Fever of 100F or higher    FOLLOW UP: If any biopsies were taken you will be contacted by phone or by letter within the next 1-3 weeks.  Call your gastroenterologist if you have not heard about the biopsies in 3 weeks.  Our staff will call the home number listed on your records the next business day following your procedure to check on you and address any questions or concerns that you may have at that time regarding the information given to you following your procedure. This is a courtesy call and so if there is no answer at the home number and we have not heard from you through the emergency physician on call, we will assume that you have returned to your regular daily activities without incident.  SIGNATURES/CONFIDENTIALITY: You and/or your care partner have signed paperwork which will be entered into your electronic medical record.  These signatures attest to the fact that that the information above on your After Visit Summary has been reviewed and is understood.  Full responsibility of the confidentiality   of this discharge information lies with you and/or your care-partner.     ANUSOL HC SUPPOSITORY ONE AT NIGHT FOR HEMORRHOIDS IF NEEDED RX GIVEN TO YOU  HIGH FIBER DIET INFO. GIVEN T O YOU

## 2012-07-10 NOTE — Progress Notes (Signed)
Patient did not have preoperative order for IV antibiotic SSI prophylaxis. (G8918)  Patient did not experience any of the following events: a burn prior to discharge; a fall within the facility; wrong site/side/patient/procedure/implant event; or a hospital transfer or hospital admission upon discharge from the facility. (G8907)  

## 2012-07-10 NOTE — Op Note (Signed)
[facility] [address] [place], [address]   COLONOSCOPY PROCEDURE REPORT  PATIENT: Tammy, Robertson  MR#: [id number] BIRTHDATE: [birth year] , 66  yrs. old GENDER: Female ENDOSCOPIST: Hart Carwin, MD REFERRED BY:  Mia Creek, M.D. PROCEDURE DATE:  07/10/2012 PROCEDURE:   Colonoscopy, screening ASA CLASS:   Class II INDICATIONS:Average risk patient for colon cancer and matrnal uncle with colon cancer, last colon 2003 was normal. MEDICATIONS: MAC sedation, administered by CRNA and Propofol (Diprivan) 180 mg IV  DESCRIPTION OF PROCEDURE:   After the risks and benefits and of the procedure were explained, informed consent was obtained.  A digital rectal exam revealed no abnormalities of the rectum.    The LB [id number]-[id number]  endoscope was introduced through the anus and advanced to the cecum, which was identified by both the appendix and ileocecal valve .  The quality of the prep was good, using MoviPrep .  The instrument was then slowly withdrawn as the colon was fully examined.     COLON FINDINGS: Mild diverticulosis was noted.   Large internal hemorrhoids were found.     Retroflexed views revealed no abnormalities.     The scope was then withdrawn from the patient and the procedure completed.  COMPLICATIONS: There were no complications. ENDOSCOPIC IMPRESSION: 1.   Mild diverticulosis was noted in the descending colon 2.   Large internal hemorrhoids  RECOMMENDATIONS: 1.  High fiber diet 2.  Anusol HC supp 1 hs   REPEAT EXAM: In 10 year(s)  for Colonoscopy.  cc:  _______________________________ eSignedHart Carwin, MD 07/10/2012 10:48 AM     PATIENT NAME:  Tammy, Robertson MR#: [id number]

## 2012-07-13 ENCOUNTER — Telehealth: Payer: Self-pay | Admitting: *Deleted

## 2012-07-13 NOTE — Telephone Encounter (Signed)
  Follow up Call-  Call back number 07/10/2012  Post procedure Call Back phone  # [phone]  Permission to leave phone message Yes     Patient questions:  Do you have a fever, pain , or abdominal swelling? no Pain Score  0 *  Have you tolerated food without any problems? yes  Have you been able to return to your normal activities? yes  Do you have any questions about your discharge instructions: Diet   no Medications  no Follow up visit  no  Do you have questions or concerns about your Care? no  Actions: * If pain score is 4 or above: No action needed, pain <4.

## 2012-07-14 ENCOUNTER — Encounter: Payer: BC Managed Care – PPO | Admitting: Internal Medicine

## 2012-10-22 ENCOUNTER — Other Ambulatory Visit: Payer: Self-pay | Admitting: Physical Medicine and Rehabilitation

## 2012-10-22 DIAGNOSIS — G44009 Cluster headache syndrome, unspecified, not intractable: Secondary | ICD-10-CM

## 2012-10-30 ENCOUNTER — Ambulatory Visit
Admission: RE | Admit: 2012-10-30 | Discharge: 2012-10-30 | Disposition: A | Discharge status: Home / Self Care | Payer: BC Managed Care – PPO | Source: Ambulatory Visit | Attending: Internal Medicine | Admitting: Internal Medicine

## 2012-10-30 ENCOUNTER — Other Ambulatory Visit: Payer: Self-pay | Admitting: Internal Medicine

## 2012-10-30 ENCOUNTER — Ambulatory Visit
Admission: RE | Admit: 2012-10-30 | Discharge: 2012-10-30 | Disposition: A | Discharge status: Home / Self Care | Payer: BC Managed Care – PPO | Source: Ambulatory Visit | Attending: Physical Medicine and Rehabilitation | Admitting: Physical Medicine and Rehabilitation

## 2012-10-30 DIAGNOSIS — R0602 Shortness of breath: Secondary | ICD-10-CM

## 2012-10-30 DIAGNOSIS — G44009 Cluster headache syndrome, unspecified, not intractable: Secondary | ICD-10-CM

## 2012-12-16 ENCOUNTER — Telehealth (INDEPENDENT_AMBULATORY_CARE_PROVIDER_SITE_OTHER): Payer: Self-pay

## 2012-12-16 NOTE — Telephone Encounter (Signed)
V/M Yr F/U is due call to schedule appt with Dr. Ezzard Standing

## 2013-02-23 ENCOUNTER — Ambulatory Visit (INDEPENDENT_AMBULATORY_CARE_PROVIDER_SITE_OTHER): Payer: BC Managed Care – PPO | Admitting: Surgery

## 2013-02-23 ENCOUNTER — Encounter (INDEPENDENT_AMBULATORY_CARE_PROVIDER_SITE_OTHER): Payer: Self-pay | Admitting: Surgery

## 2013-02-23 VITALS — BP 142/80 | HR 84 | Temp 99.4°F | Resp 16 | Ht 63.75 in | Wt 166.4 lb

## 2013-02-23 DIAGNOSIS — E041 Nontoxic single thyroid nodule: Secondary | ICD-10-CM

## 2013-02-23 NOTE — Progress Notes (Signed)
[facility]  Ovidio Kin, MD,  FACS [address] [place]  [address] [place], [place]    [address] Phone:  [phone] FAX:  [phone]   Re:   Tammy Robertson DOB:   66-Sep-1947 MRN:   [id number]  ASSESSMENT AND PLAN: 1.  Multinodular thyroid, right lobe  History of left thyroid lobectomy 11/07/2003 for benign disease.  She now sees Dr. Renae Gloss.  She's had trouble, but none with her neck.  She said her thyroid function were okay.  I'll see her back in one year.  If she is doing well, she will be 10 years out from her surgery and that can be her last visit.  2.  History of right breast nodule.  No further problems. 3.  New neck trouble with pain down her right arm.  Sees Shawn Dalton-Bethea.  I trying tramadol and anti-inflammatories. 4.  Fatigue/SOB - seeing cardiology  Sees Dr. Laurence Aly.  Just had stress test and echo.  HISTORY OF PRESENT ILLNESS: Chief Complaint  Patient presents with  . Follow-up    est thyroid ltfu yearly    [name] is a 67 y.o. (DOB: [birth year])  AA female who is a patient of SHELTON,KIMBERLY R., MD and comes to me today for thyroid.  She sees Dr. Gaye Alken from GYN standpoint.  She is having trouble with her right arm and pain.  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  She is seeing Shawn Dalton-Bethea for this.   She also has had some fatigue and SOB.  She is seeing cardiology (Dr. Laurence Aly) for this.  She just had a stress test and echo.  She is going back to go over the results.  Last [place] 10/29/2010 - 2.5 cm inferior nodule and 2.0 superior nodule. She has been on Liotrix (T3/T4). TSH - 0.015 on 12/05/2010.  PHYSICAL EXAM: BP 142/80  Pulse 84  Temp(Src) 99.4 F (37.4 C) (Temporal)  Resp 16  Ht 5' 3.75" (1.619 m)  Wt 166 lb 6.4 oz (75.479 kg)  BMI 28.8 kg/m2  Neck:  Well healed neck scar.  A right neck fullness, but no specific nodule or mass.  Lymph nodes:  No cervical, supraclavicular, or axillary adenopathy. Lungs:   Clear and symmetric. Heart:  RRR  DATA REVIEWED: To get labs from Dr. Renae Gloss.  Ovidio Kin, MD, FACS Office:  [phone]

## 2013-03-08 ENCOUNTER — Encounter (INDEPENDENT_AMBULATORY_CARE_PROVIDER_SITE_OTHER): Payer: Self-pay

## 2013-03-10 ENCOUNTER — Encounter (INDEPENDENT_AMBULATORY_CARE_PROVIDER_SITE_OTHER): Payer: Self-pay

## 2013-05-18 ENCOUNTER — Encounter (INDEPENDENT_AMBULATORY_CARE_PROVIDER_SITE_OTHER): Payer: Self-pay

## 2014-03-10 ENCOUNTER — Other Ambulatory Visit: Payer: Self-pay | Admitting: Sports Medicine

## 2014-03-10 DIAGNOSIS — M25511 Pain in right shoulder: Secondary | ICD-10-CM

## 2014-03-13 ENCOUNTER — Ambulatory Visit
Admission: RE | Admit: 2014-03-13 | Discharge: 2014-03-13 | Disposition: A | Discharge status: Home / Self Care | Payer: BC Managed Care – PPO | Source: Ambulatory Visit | Attending: Sports Medicine | Admitting: Sports Medicine

## 2014-03-13 DIAGNOSIS — M25511 Pain in right shoulder: Secondary | ICD-10-CM

## 2014-03-15 ENCOUNTER — Other Ambulatory Visit: Payer: Self-pay | Admitting: Sports Medicine

## 2014-03-15 DIAGNOSIS — M25511 Pain in right shoulder: Secondary | ICD-10-CM

## 2014-03-20 ENCOUNTER — Ambulatory Visit
Admission: RE | Admit: 2014-03-20 | Discharge: 2014-03-20 | Disposition: A | Discharge status: Home / Self Care | Payer: BC Managed Care – PPO | Source: Ambulatory Visit | Attending: Sports Medicine | Admitting: Sports Medicine

## 2014-03-20 DIAGNOSIS — M25511 Pain in right shoulder: Secondary | ICD-10-CM

## 2014-04-28 ENCOUNTER — Ambulatory Visit (INDEPENDENT_AMBULATORY_CARE_PROVIDER_SITE_OTHER): Payer: BC Managed Care – PPO | Admitting: Surgery

## 2014-04-29 ENCOUNTER — Ambulatory Visit (INDEPENDENT_AMBULATORY_CARE_PROVIDER_SITE_OTHER): Payer: BC Managed Care – PPO | Admitting: Surgery

## 2014-06-02 ENCOUNTER — Ambulatory Visit (INDEPENDENT_AMBULATORY_CARE_PROVIDER_SITE_OTHER): Payer: Medicare Other | Admitting: Emergency Medicine

## 2014-06-02 VITALS — BP 148/70 | HR 87 | Temp 98.5°F | Resp 20 | Ht 63.75 in | Wt 159.6 lb

## 2014-06-02 DIAGNOSIS — H8110 Benign paroxysmal vertigo, unspecified ear: Secondary | ICD-10-CM

## 2014-06-02 NOTE — Patient Instructions (Signed)
Benign Positional Vertigo Vertigo means you feel like you or your surroundings are moving when they are not. Benign positional vertigo is the most common form of vertigo. Benign means that the cause of your condition is not serious. Benign positional vertigo is more common in older adults. CAUSES  Benign positional vertigo is the result of an upset in the labyrinth system. This is an area in the middle ear that helps control your balance. This may be caused by a viral infection, head injury, or repetitive motion. However, often no specific cause is found. SYMPTOMS  Symptoms of benign positional vertigo occur when you move your head or eyes in different directions. Some of the symptoms may include:  Loss of balance and falls.  Vomiting.  Blurred vision.  Dizziness.  Nausea.  Involuntary eye movements (nystagmus). DIAGNOSIS  Benign positional vertigo is usually diagnosed by physical exam. If the specific cause of your benign positional vertigo is unknown, your caregiver may perform imaging tests, such as magnetic resonance imaging (MRI) or computed tomography (CT). TREATMENT  Your caregiver may recommend movements or procedures to correct the benign positional vertigo. Medicines such as meclizine, benzodiazepines, and medicines for nausea may be used to treat your symptoms. In rare cases, if your symptoms are caused by certain conditions that affect the inner ear, you may need surgery. HOME CARE INSTRUCTIONS   Follow your caregiver's instructions.  Move slowly. Do not make sudden body or head movements.  Avoid driving.  Avoid operating heavy machinery.  Avoid performing any tasks that would be dangerous to you or others during a vertigo episode.  Drink enough fluids to keep your urine clear or pale yellow. SEEK IMMEDIATE MEDICAL CARE IF:   You develop problems with walking, weakness, numbness, or using your arms, hands, or legs.  You have difficulty speaking.  You develop  severe headaches.  Your nausea or vomiting continues or gets worse.  You develop visual changes.  Your family or friends notice any behavioral changes.  Your condition gets worse.  You have a fever.  You develop a stiff neck or sensitivity to light. MAKE SURE YOU:   Understand these instructions.  Will watch your condition.  Will get help right away if you are not doing well or get worse. Document Released: 05/13/2006 Document Revised: 10/28/2011 Document Reviewed: 04/25/2011 ExitCare Patient Information 2015 [employer]. This information is not intended to replace advice given to you by your health care provider. Make sure you discuss any questions you have with your health care provider.    

## 2014-06-02 NOTE — Progress Notes (Signed)
[facility] [address], [place] [address] [phone]  Date:  06/02/2014   Name:  Tammy Robertson   DOB:  1946-01-03   MRN:  [id number]  PCP:  Salena Saner., MD    Chief Complaint: Sinusitis   History of Present Illness:  Tammy Robertson is a 68 y.o. very pleasant female patient who presents with the following:  Ill for two weeks with vertigo.  Was treated with prednisone and meclizine by phone consult with [facility]. Continues to have dizziness that has improved but not resolved. No cough, coryza, fever or chills.  No neuro or visual symptoms.  No motor symptoms. Is leaving in two days for [place] for 9 days. No history of injury or LOC. Has headache in frontal region with sinus congestion. No improvement with over the counter medications or other home remedies.  Denies other complaint or health concern today.   Patient Active Problem List   Diagnosis Date Noted  . Thyroid nodule, right. 12/26/2011  . HYPOTHYROIDISM 03/21/2008  . HYPERTENSION 03/21/2008  . ALLERGIC RHINITIS 03/21/2008  . 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 GERD 03/21/2008    Past Medical History  Diagnosis Date  . GERD (gastroesophageal reflux disease)   . Osteoporosis   . Thyroid disease   . Hypertension     Past Surgical History  Procedure Laterality Date  . Tonsillectomy and adenoidectomy    . Abdominal hysterectomy    . Thyroidectomy, partial  2000    due to goiter    History  Substance Use Topics  . Smoking status: Never Smoker   . Smokeless tobacco: Never Used  . Alcohol Use: 0.0 oz/week     Comment: occassional drinker    Family History  Problem Relation Age of Onset  . Cancer Mother     uterine  . Stroke Father   . Colon cancer Neg Hx     Allergies  Allergen Reactions  . Codeine     Nausea- no rash, itching, etc    Medication list has been reviewed and updated.  Current Outpatient Prescriptions on File Prior to Visit  Medication Sig  Dispense Refill  . cholecalciferol (VITAMIN D) 1000 UNITS tablet Take 10,000 Units by mouth every other day.      . Multiple Vitamins-Minerals (MULTIVITAMIN WITH MINERALS) tablet Take 1 tablet by mouth daily.      Marland Kitchen azelastine (ASTELIN) 137 MCG/SPRAY nasal spray Place 1 spray into the nose 2 (two) times daily. Use in each nostril as directed  30 mL  12  . cetirizine (ZYRTEC) 10 MG tablet Take 10 mg by mouth daily.      . Glutamine POWD Take by mouth.      . IODINE, KELP, PO Take by mouth.      Marland Kitchen LIOTRIX, T3-T4, PO Take by mouth daily. Prescribed by Dr. Adriana Simas      . Red Yeast Rice Extract (RED YEAST RICE PO) Take by mouth.      . traMADol (ULTRAM) 50 MG tablet       . vitamin A 25000 UNIT capsule Take 25,000 Units by mouth once a week.      . vitamin k 100 MCG tablet Take 100 mcg by mouth daily.       No current facility-administered medications on file prior to visit.    Review of Systems:  As per HPI, otherwise negative.    Physical Examination: Filed Vitals:   06/02/14 1818  BP: 148/70  Pulse: 87  Temp: 98.5 F (36.9 C)  Resp: 20   Filed Vitals:   06/02/14 1818  Height: 5' 3.75" (1.619 m)  Weight: 159 lb 9.6 oz (72.394 kg)   Body mass index is 27.62 kg/(m^2). Ideal Body Weight: Weight in (lb) to have BMI = 25: 144.2  GEN: WDWN, NAD, Non-toxic, A & O x 3 HEENT: Atraumatic, Normocephalic. Neck supple. No masses, No LAD.  PRRERLA EOMI CN 2-12 intact. Ears and Nose: No external deformity.   CV: RRR, No M/G/R. No JVD. No thrill. No extra heart sounds. PULM: CTA B, no wheezes, crackles, rhonchi. No retractions. No resp. distress. No accessory muscle use. ABD: S, NT, ND, +BS. No rebound. No HSM. EXTR: No c/c/e NEURO Normal gait. Romberg normal.  Tandem gait intact PSYCH: Normally interactive. Conversant. Not depressed or anxious appearing.  Calm demeanor.    Assessment and Plan: BPV Continue antivert ENT  Signed,  Ellison Carwin, MD

## 2014-12-06 ENCOUNTER — Other Ambulatory Visit: Payer: Self-pay | Admitting: Obstetrics and Gynecology

## 2014-12-07 LAB — CYTOLOGY - PAP

## 2014-12-15 ENCOUNTER — Other Ambulatory Visit: Payer: Self-pay | Admitting: Obstetrics and Gynecology

## 2015-02-10 ENCOUNTER — Ambulatory Visit (INDEPENDENT_AMBULATORY_CARE_PROVIDER_SITE_OTHER): Payer: Medicare Other | Admitting: Family Medicine

## 2015-02-10 VITALS — BP 130/80 | HR 96 | Temp 98.0°F | Resp 17 | Ht 62.0 in | Wt 157.6 lb

## 2015-02-10 DIAGNOSIS — R7309 Other abnormal glucose: Secondary | ICD-10-CM | POA: Diagnosis not present

## 2015-02-10 DIAGNOSIS — J309 Allergic rhinitis, unspecified: Secondary | ICD-10-CM | POA: Diagnosis not present

## 2015-02-10 DIAGNOSIS — R7303 Prediabetes: Secondary | ICD-10-CM

## 2015-02-10 DIAGNOSIS — J329 Chronic sinusitis, unspecified: Secondary | ICD-10-CM | POA: Diagnosis not present

## 2015-02-10 DIAGNOSIS — R062 Wheezing: Secondary | ICD-10-CM | POA: Diagnosis not present

## 2015-02-10 LAB — GLUCOSE, POCT (MANUAL RESULT ENTRY): POC Glucose: 90 mg/dl (ref 70–99)

## 2015-02-10 MED ORDER — ALBUTEROL SULFATE HFA 108 (90 BASE) MCG/ACT IN AERS: 2.0000 | INHALATION_SPRAY | Freq: Four times a day (QID) | RESPIRATORY_TRACT | Status: AC | PRN

## 2015-02-10 MED ORDER — AMOXICILLIN 875 MG PO TABS: 875.0000 mg | ORAL_TABLET | Freq: Two times a day (BID) | ORAL | Status: AC

## 2015-02-10 MED ORDER — PREDNISONE 20 MG PO TABS: ORAL_TABLET | ORAL | Status: AC

## 2015-02-10 MED ORDER — FLUTICASONE PROPIONATE 50 MCG/ACT NA SUSP: 2.0000 | Freq: Every day | NASAL | Status: AC

## 2015-02-10 NOTE — Progress Notes (Signed)
  Subjective:  Patient ID: Tammy Robertson, female    DOB: 1945-12-25  Age: 69 y.o. MRN: [id number]  Patient is here with a history of having a lot of problems with her sinuses and coughing. Actually is been going on since back in the winter. Since allergies started. She has some nasal drainage, purulent at times. Mostly watery. A lot of postnasal drainage. She does not have much of a sore throat. She has been coughing quite a lot. She is start taking some Mucinex DM at night which is sufficient for her. She does not smoke. She's not been running a fever. No GI or GU complaints.  She has a primary care physician at [facility]. She has been told that she was prediabetic, and was started on metformin. Does not know what her A1c is or what her sugars run. 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 Active in her church.  Past, family, social histories were reviewed.   Objective:   Alert and oriented. Her TMs are normal. Eyes not injected. Throat not erythematous. No tenderness of the sinuses but she sniffles some. Neck supple without nodes. Actually the throat is not erythematous but the uvula is long and dangling. Her chest was clear to auscultation but has some wheezing when she coughs. Heart regular without murmurs. Abdomen soft without mass or tenderness.   Results for orders placed or performed in visit on 02/10/15  POCT glucose (manual entry)  Result Value Ref Range   POC Glucose 90 70 - 99 mg/dl    Assessment & Plan:   Assessment: Rhinosinusitis Allergic rhinitis Asthmatic bronchitis Prediabetes  Plan: Patient was instructed on the use of the fluticasone and prednisone. And albuterol. Explained to her that although she is prediabetic, her sugar is very good and she should be able to tolerate the little rise of sugar that she will get over the next few days while on the prednisone. Discussed with her side effects of prednisone. Below are  the other medications prescribed. Patient Instructions  Continue taking the Claritin  Take the prednisone 3 pills daily for 2 days, then 2 daily for 2 days, then 1 daily for 2 days, then one half daily for 4 days. Best taken after breakfast  Use the Flonase (fluticasone) nose spray 2 sprays each nostril twice daily for 3 days, then once daily  Continue using your Mucinex DM cough syrup  Take the amoxicillin one twice daily for infection  Use the albuterol inhaler 2 inhalations every 6 hours if needed for wheezing, waiting a couple of minutes between inhalations.     Marlene Pfluger, MD 02/10/2015

## 2015-02-10 NOTE — Patient Instructions (Addendum)
Continue taking the Claritin  Take the prednisone 3 pills daily for 2 days, then 2 daily for 2 days, then 1 daily for 2 days, then one half daily for 4 days. Best taken after breakfast  Use the Flonase (fluticasone) nose spray 2 sprays each nostril twice daily for 3 days, then once daily  Continue using your Mucinex DM cough syrup  Take the amoxicillin one twice daily for infection  Use the albuterol inhaler 2 inhalations every 6 hours if needed for wheezing, waiting a couple of minutes between inhalations.

## 2015-03-09 ENCOUNTER — Encounter: Payer: Self-pay | Admitting: Internal Medicine

## 2016-01-26 ENCOUNTER — Other Ambulatory Visit: Payer: Self-pay | Admitting: Family Medicine

## 2016-01-26 DIAGNOSIS — R1011 Right upper quadrant pain: Secondary | ICD-10-CM

## 2016-02-05 ENCOUNTER — Ambulatory Visit
Admission: RE | Admit: 2016-02-05 | Discharge: 2016-02-05 | Disposition: A | Discharge status: Home / Self Care | Payer: Medicare Other | Source: Ambulatory Visit | Attending: Family Medicine | Admitting: Family Medicine

## 2016-02-05 DIAGNOSIS — R1011 Right upper quadrant pain: Secondary | ICD-10-CM

## 2016-02-21 ENCOUNTER — Telehealth: Payer: Self-pay | Admitting: Internal Medicine

## 2016-02-21 NOTE — Telephone Encounter (Signed)
Appointment scheduled with Dr. Gessner. °

## 2016-02-21 NOTE — Telephone Encounter (Signed)
ok 

## 2016-03-19 ENCOUNTER — Other Ambulatory Visit: Payer: Self-pay | Admitting: Family Medicine

## 2016-03-19 DIAGNOSIS — R1030 Lower abdominal pain, unspecified: Secondary | ICD-10-CM

## 2016-03-27 ENCOUNTER — Ambulatory Visit
Admission: RE | Admit: 2016-03-27 | Discharge: 2016-03-27 | Disposition: A | Discharge status: Home / Self Care | Payer: Medicare Other | Source: Ambulatory Visit | Attending: Family Medicine | Admitting: Family Medicine

## 2016-03-27 DIAGNOSIS — R1030 Lower abdominal pain, unspecified: Secondary | ICD-10-CM

## 2016-03-27 MED ORDER — IOPAMIDOL (ISOVUE-300) INJECTION 61%: 100.0000 mL | Freq: Once | INTRAVENOUS | Status: DC | PRN

## 2016-04-23 ENCOUNTER — Ambulatory Visit: Payer: Medicare Other | Admitting: Internal Medicine

## 2017-01-10 ENCOUNTER — Other Ambulatory Visit: Payer: Self-pay | Admitting: Obstetrics and Gynecology

## 2017-01-10 DIAGNOSIS — E041 Nontoxic single thyroid nodule: Secondary | ICD-10-CM

## 2017-01-17 ENCOUNTER — Ambulatory Visit
Admission: RE | Admit: 2017-01-17 | Discharge: 2017-01-17 | Disposition: A | Discharge status: Home / Self Care | Payer: Medicare Other | Source: Ambulatory Visit | Attending: Obstetrics and Gynecology | Admitting: Obstetrics and Gynecology

## 2017-01-17 DIAGNOSIS — E041 Nontoxic single thyroid nodule: Secondary | ICD-10-CM

## 2017-12-11 ENCOUNTER — Emergency Department (HOSPITAL_COMMUNITY)
Admission: EM | Admit: 2017-12-11 | Discharge: 2017-12-11 | Disposition: A | Discharge status: Home / Self Care | Payer: Medicare Other | Attending: Emergency Medicine | Admitting: Emergency Medicine

## 2017-12-11 ENCOUNTER — Encounter (HOSPITAL_COMMUNITY): Payer: Self-pay | Admitting: *Deleted

## 2017-12-11 ENCOUNTER — Other Ambulatory Visit: Payer: Self-pay

## 2017-12-11 ENCOUNTER — Emergency Department (HOSPITAL_COMMUNITY): Payer: Medicare Other

## 2017-12-11 DIAGNOSIS — S199XXA Unspecified injury of neck, initial encounter: Secondary | ICD-10-CM | POA: Diagnosis present

## 2017-12-11 DIAGNOSIS — I5032 Chronic diastolic (congestive) heart failure: Secondary | ICD-10-CM | POA: Diagnosis not present

## 2017-12-11 DIAGNOSIS — Z7984 Long term (current) use of oral hypoglycemic drugs: Secondary | ICD-10-CM | POA: Insufficient documentation

## 2017-12-11 DIAGNOSIS — Y998 Other external cause status: Secondary | ICD-10-CM | POA: Insufficient documentation

## 2017-12-11 DIAGNOSIS — S161XXA Strain of muscle, fascia and tendon at neck level, initial encounter: Secondary | ICD-10-CM | POA: Insufficient documentation

## 2017-12-11 DIAGNOSIS — E039 Hypothyroidism, unspecified: Secondary | ICD-10-CM | POA: Insufficient documentation

## 2017-12-11 DIAGNOSIS — Y33XXXA Other specified events, undetermined intent, initial encounter: Secondary | ICD-10-CM | POA: Diagnosis not present

## 2017-12-11 DIAGNOSIS — E785 Hyperlipidemia, unspecified: Secondary | ICD-10-CM | POA: Diagnosis not present

## 2017-12-11 DIAGNOSIS — Y929 Unspecified place or not applicable: Secondary | ICD-10-CM | POA: Insufficient documentation

## 2017-12-11 DIAGNOSIS — Y939 Activity, unspecified: Secondary | ICD-10-CM | POA: Diagnosis not present

## 2017-12-11 DIAGNOSIS — Z79899 Other long term (current) drug therapy: Secondary | ICD-10-CM | POA: Insufficient documentation

## 2017-12-11 DIAGNOSIS — I110 Hypertensive heart disease with heart failure: Secondary | ICD-10-CM | POA: Insufficient documentation

## 2017-12-11 LAB — CBC
HCT: 44.7 % (ref 36.0–46.0)
Hemoglobin: 14.6 g/dL (ref 12.0–15.0)
MCH: 30.2 pg (ref 26.0–34.0)
MCHC: 32.7 g/dL (ref 30.0–36.0)
MCV: 92.4 fL (ref 78.0–100.0)
Platelets: 226 10*3/uL (ref 150–400)
RBC: 4.84 MIL/uL (ref 3.87–5.11)
RDW: 13 % (ref 11.5–15.5)
WBC: 10.1 10*3/uL (ref 4.0–10.5)

## 2017-12-11 LAB — BASIC METABOLIC PANEL
Anion gap: 7 (ref 5–15)
BUN: 11 mg/dL (ref 6–20)
CHLORIDE: 111 mmol/L (ref 101–111)
CO2: 23 mmol/L (ref 22–32)
Calcium: 9.6 mg/dL (ref 8.9–10.3)
Creatinine, Ser: 0.77 mg/dL (ref 0.44–1.00)
Glucose, Bld: 131 mg/dL — ABNORMAL HIGH (ref 65–99)
Potassium: 5.6 mmol/L — ABNORMAL HIGH (ref 3.5–5.1)
SODIUM: 141 mmol/L (ref 135–145)

## 2017-12-11 LAB — I-STAT TROPONIN, ED: TROPONIN I, POC: 0 ng/mL (ref 0.00–0.08)

## 2017-12-11 MED ORDER — DICLOFENAC SODIUM 1 % TD GEL: 2.0000 g | Freq: Four times a day (QID) | TRANSDERMAL | 0 refills | Status: AC

## 2017-12-11 NOTE — ED Triage Notes (Signed)
Pt reports onset last night of pain and spasms to right side of neck. Has difficulty turning her head due to pain. Pain radiates into her right shoulder. Ambulatory and no acute distress is noted at triage.

## 2017-12-11 NOTE — ED Provider Notes (Signed)
[facility] EMERGENCY DEPARTMENT Provider Note   CSN: [id number] Arrival date & time: 12/11/17  0957     History   Chief Complaint Chief Complaint  Patient presents with  . Neck Pain    HPI Tammy Robertson is a 72 y.o. female.  HPI   72 year old female presents today with complaints of right-sided neck pain.  Patient notes pain started yesterday with a achy pain in her right lateral neck.  Patient notes that symptoms are worse with head movements worse with looking to the right and far left.  She notes this feels tight.  She denies any injuries to her neck, denies any radiation of pain up into the head or into the chest.  She denies any chest pain or shortness of breath, dizziness, neurological deficits, or any other concerning signs or symptoms today.  Patient has not tried any medication for the symptoms.  Patient notes that she was prescribed meloxicam for her arthritis but had not tried it today.  Patient denies history of hypertension.  Past Medical History:  Diagnosis Date  . Bicipital tendinitis of right shoulder   . DDD (degenerative disc disease), lumbar   . Diastolic dysfunction   . GERD (gastroesophageal reflux disease)   . Glaucoma   . Hyperlipidemia   . Hypertension   . Hypothyroidism   . Osteoporosis   . Osteoporosis   . Prediabetes   . Right rotator cuff tendonitis   . SLAP (superior labrum from anterior to posterior) tear   . Vitamin D deficiency     Patient Active Problem List   Diagnosis Date Noted  . Thyroid nodule, right. 12/26/2011  . HYPOTHYROIDISM 03/21/2008  . HYPERTENSION 03/21/2008  . ALLERGIC RHINITIS 03/21/2008  . 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 GERD 03/21/2008    Past Surgical History:  Procedure Laterality Date  . ABDOMINAL HYSTERECTOMY    . THYROIDECTOMY, PARTIAL  2000   due to goiter  . TONSILLECTOMY AND ADENOIDECTOMY       OB History   None      Home Medications    Prior to Admission  medications   Medication Sig Start Date End Date Taking? Authorizing Provider  albuterol (PROVENTIL HFA;VENTOLIN HFA) 108 (90 BASE) MCG/ACT inhaler Inhale 2 puffs into the lungs every 6 (six) hours as needed for wheezing or shortness of breath. 02/10/15   Posey Boyer, MD  amoxicillin (AMOXIL) 875 MG tablet Take 1 tablet (875 mg total) by mouth 2 (two) times daily. 02/10/15   Posey Boyer, MD  azelastine (ASTELIN) 137 MCG/SPRAY nasal spray Place 1 spray into the nose 2 (two) times daily. Use in each nostril as directed 04/30/12 04/30/13  Copland, Gay Filler, MD  bimatoprost (LUMIGAN) 0.03 % ophthalmic solution 1 drop at bedtime.    [provider]  Brinzolamide-Brimonidine [facility] OP) Apply to eye.    [provider]  cetirizine (ZYRTEC) 10 MG tablet Take 10 mg by mouth daily.    [provider]  cholecalciferol (VITAMIN D) 1000 UNITS tablet Take 10,000 Units by mouth every other day.    [provider]  Cholecalciferol (VITAMIN D-3) 5000 UNITS TABS Take 0.5 tablets by mouth daily.    [provider]  co-enzyme Q-10 30 MG capsule Take 30 mg by mouth 1 day or 1 dose.    [provider]  diclofenac sodium (VOLTAREN) 1 % GEL Apply 2 g topically 4 (four) times daily. 12/11/17   Valencia Kassa, Dellis Filbert, PA-C  fluticasone Asencion Islam)  50 MCG/ACT nasal spray Place 2 sprays into both nostrils daily. 02/10/15   Posey Boyer, MD  Glutamine POWD Take by mouth.    [provider]  IODINE, KELP, PO Take by mouth.    [provider]  LIOTRIX, T3-T4, PO Take by mouth daily. Prescribed by Dr. Adriana Simas    [provider]  losartan (COZAAR) 25 MG tablet Take 25 mg by mouth daily.    [provider]  meclizine (ANTIVERT) 25 MG tablet Take 25 mg by mouth 3 (three) times daily as needed for dizziness.    [provider]  meloxicam (MOBIC) 7.5 MG tablet Take 7.5 mg by mouth daily.    [provider]  metFORMIN  (GLUCOPHAGE) 500 MG tablet Take by mouth 1 day or 1 dose.    [provider]  Multiple Vitamins-Minerals (MULTIVITAMIN WITH MINERALS) tablet Take 1 tablet by mouth daily.    [provider]  predniSONE (DELTASONE) 20 MG tablet Take 3 daily for 2 days, then 2 daily for 2 days, then 1 daily for 2 days, then one half daily for 4 days 02/10/15   Posey Boyer, MD  Red Yeast Rice Extract (RED YEAST RICE PO) Take by mouth.    [provider]  traMADol Veatrice Bourbon) 50 MG tablet  02/17/13   [provider]  vitamin A 25000 UNIT capsule Take 25,000 Units by mouth once a week.    [provider]  vitamin k 100 MCG tablet Take 100 mcg by mouth daily.    [provider]    Family History Family History  Problem Relation Age of Onset  . Cancer Mother        uterine  . Stroke Father   . Colon cancer Neg Hx     Social History Social History   Tobacco Use  . Smoking status: Never Smoker  . Smokeless tobacco: Never Used  Substance Use Topics  . Alcohol use: Yes    Alcohol/week: 0.0 oz    Comment: occassional drinker  . Drug use: No     Allergies   Codeine   Review of Systems Review of Systems   Physical Exam Updated Vital Signs BP (!) 163/95 (BP Location: Right Arm)   Pulse 89   Temp 98.7 F (37.1 C) (Oral)   Resp 18   SpO2 98%   Physical Exam  Constitutional: She is oriented to person, place, and time. She appears well-developed and well-nourished.  HENT:  Head: Normocephalic and atraumatic.  Eyes: Pupils are equal, round, and reactive to light. Conjunctivae are normal. Right eye exhibits no discharge. Left eye exhibits no discharge. No scleral icterus.  Neck: Normal range of motion. No JVD present. No tracheal deviation present. No thyromegaly present.  Anterior neck supple nontender with no swelling  Cardiovascular: Normal rate, regular rhythm, normal heart sounds and intact distal pulses.  No carotid bruits    Pulmonary/Chest: Effort normal and breath sounds normal. No stridor. No respiratory distress. She has no wheezes. She has no rales. She exhibits no tenderness.  Musculoskeletal:  Tenderness to palpation of the right sternocleidomastoid-no swelling, slight tension noted-pain with looking right and looking left-no warmth to touch or redness  Neurological: She is alert and oriented to person, place, and time. No sensory deficit. She exhibits normal muscle tone. Coordination normal.  Psychiatric: She has a normal mood and affect. Her behavior is normal. Judgment and thought content normal.  Nursing note and vitals reviewed.  ED Treatments / Results  Labs (all labs ordered are listed, but only abnormal results are displayed) Labs Reviewed  BASIC METABOLIC PANEL - Abnormal; Notable for the following components:      Result Value   Potassium 5.6 (*)    Glucose, Bld 131 (*)    All other components within normal limits  CBC  I-STAT TROPONIN, ED    EKG None  Radiology Dg Chest 2 View  Result Date: 12/11/2017 CLINICAL DATA:  Neck pain on the right radiating into the chest over the last day EXAM: CHEST - 2 VIEW COMPARISON:  Chest x-ray 10/30/2012 FINDINGS: No active infiltrate or effusion is seen. Mediastinal and hilar contours are unremarkable. There is somewhat prominent soft tissue high in the right paratracheal region with some deviation of the tracheal air shadow to the left of midline. This could indicate enlargement of the right lobe of thyroid in this patient who apparently has undergone prior left thyroidectomy. Heart is within upper limits normal and stable. No bony abnormality is seen. IMPRESSION: 1. No pneumonia or effusion. 2. Cannot exclude enlargement of the right lobe of thyroid in this patient was undergone prior left thyroidectomy. Electronically Signed   By: Ivar Drape M.D.   On: 12/11/2017 11:42    Procedures Procedures (including critical care time)  Medications  Ordered in ED Medications - No data to display   Initial Impression / Assessment and Plan / ED Course  I have reviewed the triage vital signs and the nursing notes.  Pertinent labs & imaging results that were available during my care of the patient were reviewed by me and considered in my medical decision making (see chart for details).      Final Clinical Impressions(s) / ED Diagnoses   Final diagnoses:  Strain of neck muscle, initial encounter   Labs: I-STAT troponin, BMP, CBC  Imaging: DG chest 2 view  Consults:  Therapeutics:  Discharge Meds:   Assessment/Plan:   72 year old female presents today with complaints of neck pain.  This is most likely muscular in nature as she has tenderness along the musculature, she has no associated complaints including headache, dizziness, neurological deficits, chest pain or shortness of breath.  I have very low suspicion for vascular etiology.  Patient was slightly hypertensive here, blood pressure 155/80 upon my inspection.  Patient does not have a history of hypertension, she will continue monitoring her blood pressure at home and follow-up with primary care if it remains elevated.  Patient will return immediately with any new or worsening signs or symptoms.  She verbalized understanding and agreement to today's plan had no further questions or concerns the time discharge.    ED Discharge Orders        Ordered    diclofenac sodium (VOLTAREN) 1 % GEL  4 times daily     12/11/17 1401       Okey Regal, Hershal Coria 12/11/17 1403    Dorie Rank, MD 12/12/17 1424

## 2017-12-11 NOTE — Discharge Instructions (Addendum)
Please read attached information. If you experience any new or worsening signs or symptoms please return to the emergency room for evaluation. Please follow-up with your primary care provider or specialist as discussed. Please use medication prescribed only as directed and discontinue taking if you have any concerning signs or symptoms.   °

## 2018-01-16 ENCOUNTER — Other Ambulatory Visit (HOSPITAL_COMMUNITY): Payer: Self-pay | Admitting: Obstetrics and Gynecology

## 2018-01-16 DIAGNOSIS — E041 Nontoxic single thyroid nodule: Secondary | ICD-10-CM

## 2018-01-19 ENCOUNTER — Other Ambulatory Visit: Payer: Self-pay | Admitting: Obstetrics and Gynecology

## 2018-01-19 DIAGNOSIS — E041 Nontoxic single thyroid nodule: Secondary | ICD-10-CM

## 2018-01-20 ENCOUNTER — Ambulatory Visit (HOSPITAL_COMMUNITY): Payer: Medicare Other

## 2018-01-26 ENCOUNTER — Ambulatory Visit
Admission: RE | Admit: 2018-01-26 | Discharge: 2018-01-26 | Disposition: A | Discharge status: Home / Self Care | Payer: Medicare Other | Source: Ambulatory Visit | Attending: Obstetrics and Gynecology | Admitting: Obstetrics and Gynecology

## 2018-01-26 DIAGNOSIS — E041 Nontoxic single thyroid nodule: Secondary | ICD-10-CM

## 2018-07-15 ENCOUNTER — Other Ambulatory Visit: Payer: Self-pay | Admitting: Family Medicine

## 2018-07-21 ENCOUNTER — Other Ambulatory Visit: Payer: Self-pay | Admitting: Family Medicine

## 2018-07-21 DIAGNOSIS — R911 Solitary pulmonary nodule: Secondary | ICD-10-CM

## 2018-07-21 DIAGNOSIS — R9389 Abnormal findings on diagnostic imaging of other specified body structures: Secondary | ICD-10-CM

## 2018-07-27 ENCOUNTER — Ambulatory Visit
Admission: RE | Admit: 2018-07-27 | Discharge: 2018-07-27 | Disposition: A | Discharge status: Home / Self Care | Payer: Medicare Other | Source: Ambulatory Visit | Attending: Family Medicine | Admitting: Family Medicine

## 2018-07-27 DIAGNOSIS — R9389 Abnormal findings on diagnostic imaging of other specified body structures: Secondary | ICD-10-CM

## 2018-07-27 DIAGNOSIS — R911 Solitary pulmonary nodule: Secondary | ICD-10-CM

## 2018-07-27 MED ORDER — IOPAMIDOL (ISOVUE-300) INJECTION 61%
75.0000 mL | Freq: Once | INTRAVENOUS | Status: AC | PRN
  Administered 2018-07-27: 75 mL via INTRAVENOUS

## 2018-09-09 ENCOUNTER — Other Ambulatory Visit: Payer: Self-pay | Admitting: Endocrinology

## 2018-09-09 DIAGNOSIS — D352 Benign neoplasm of pituitary gland: Secondary | ICD-10-CM

## 2018-09-22 ENCOUNTER — Other Ambulatory Visit: Payer: Medicare Other

## 2018-09-22 ENCOUNTER — Ambulatory Visit
Admission: RE | Admit: 2018-09-22 | Discharge: 2018-09-22 | Disposition: A | Discharge status: Home / Self Care | Payer: Medicare Other | Source: Ambulatory Visit | Attending: Endocrinology | Admitting: Endocrinology

## 2018-09-22 DIAGNOSIS — D352 Benign neoplasm of pituitary gland: Secondary | ICD-10-CM

## 2018-09-22 MED ORDER — GADOBENATE DIMEGLUMINE 529 MG/ML IV SOLN
7.0000 mL | Freq: Once | INTRAVENOUS | Status: AC | PRN
  Administered 2018-09-22: 7 mL via INTRAVENOUS

## 2020-02-02 DIAGNOSIS — Z7984 Long term (current) use of oral hypoglycemic drugs: Secondary | ICD-10-CM | POA: Diagnosis not present

## 2020-02-02 DIAGNOSIS — Z1389 Encounter for screening for other disorder: Secondary | ICD-10-CM | POA: Diagnosis not present

## 2020-02-02 DIAGNOSIS — E785 Hyperlipidemia, unspecified: Secondary | ICD-10-CM | POA: Diagnosis not present

## 2020-02-02 DIAGNOSIS — E039 Hypothyroidism, unspecified: Secondary | ICD-10-CM | POA: Diagnosis not present

## 2020-02-02 DIAGNOSIS — E1169 Type 2 diabetes mellitus with other specified complication: Secondary | ICD-10-CM | POA: Diagnosis not present

## 2020-02-02 DIAGNOSIS — Z0000 Encounter for general adult medical examination without abnormal findings: Secondary | ICD-10-CM | POA: Diagnosis not present

## 2020-02-02 DIAGNOSIS — M810 Age-related osteoporosis without current pathological fracture: Secondary | ICD-10-CM | POA: Diagnosis not present

## 2020-02-02 DIAGNOSIS — E538 Deficiency of other specified B group vitamins: Secondary | ICD-10-CM | POA: Diagnosis not present

## 2020-02-10 DIAGNOSIS — Z1231 Encounter for screening mammogram for malignant neoplasm of breast: Secondary | ICD-10-CM | POA: Diagnosis not present

## 2020-02-15 DIAGNOSIS — N958 Other specified menopausal and perimenopausal disorders: Secondary | ICD-10-CM | POA: Diagnosis not present

## 2020-02-15 DIAGNOSIS — M2669 Other specified disorders of temporomandibular joint: Secondary | ICD-10-CM | POA: Diagnosis not present

## 2020-02-15 DIAGNOSIS — H8110 Benign paroxysmal vertigo, unspecified ear: Secondary | ICD-10-CM | POA: Diagnosis not present

## 2020-02-15 DIAGNOSIS — M816 Localized osteoporosis [Lequesne]: Secondary | ICD-10-CM | POA: Diagnosis not present

## 2020-02-15 DIAGNOSIS — H9202 Otalgia, left ear: Secondary | ICD-10-CM | POA: Diagnosis not present

## 2020-03-22 DIAGNOSIS — M810 Age-related osteoporosis without current pathological fracture: Secondary | ICD-10-CM | POA: Diagnosis not present

## 2020-03-29 IMAGING — CT CT CHEST W/ CM
2 of 4 series · 12 of 36 positions shown, 15 images · IV contrast (iopamidol)
Comparison: No outside images or report available. Prior chest
x-ray in our system from 04/27/2018

CLINICAL DATA: Abnormal outside chest x-ray.  Question mass.

EXAM:
CT CHEST WITH CONTRAST
TECHNIQUE: Multidetector CT imaging of the chest was performed during
intravenous contrast administration.
CONTRAST:  75mL C6FQRW-TYY IOPAMIDOL (C6FQRW-TYY) INJECTION 61%
Creatinine was obtained on site at [HOSPITAL] at [REDACTED].
Results: Creatinine 0.8 mg/dL.

[Series 2: chest 2.00 br40 s3 ax · axial · 0.58mm/px · z∈[+1537,+1779]mm · 9 of 145 slices shown, 12 images]
[im 12/145  mediastinal]
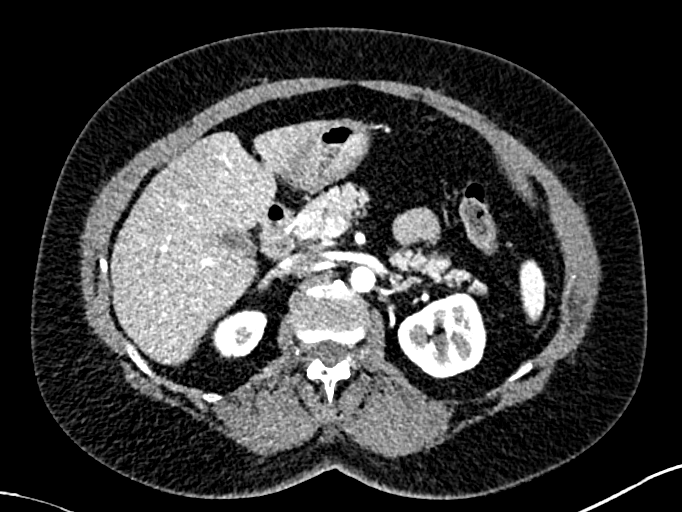
[im 12/145  lung]
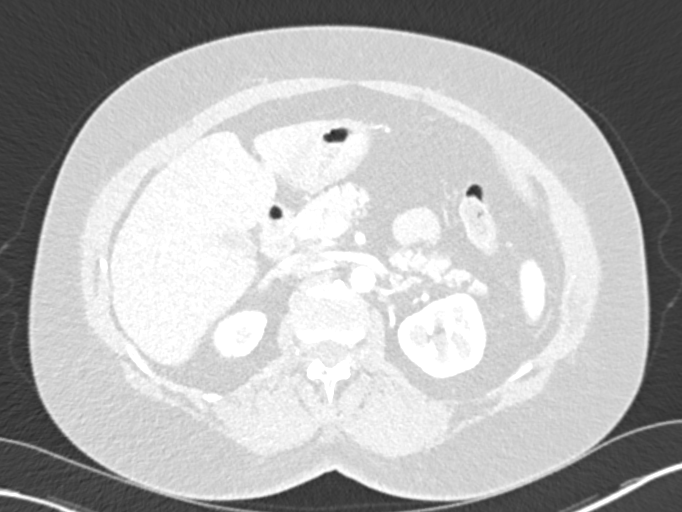
[im 34/145  lung]
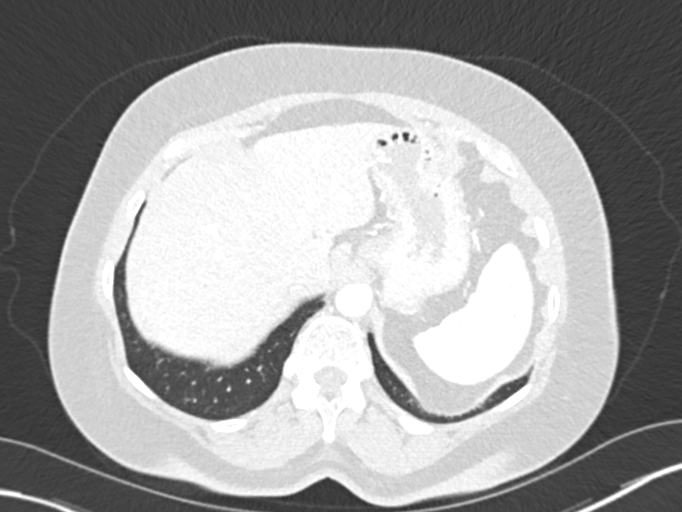
[im 45/145  lung]
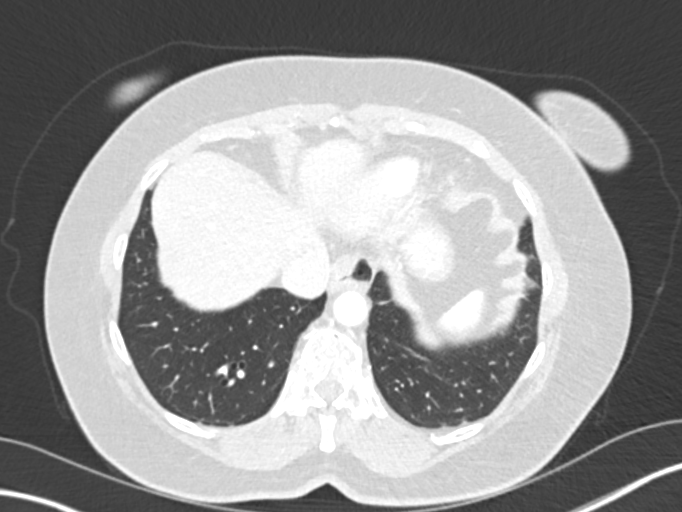
[im 56/145  lung]
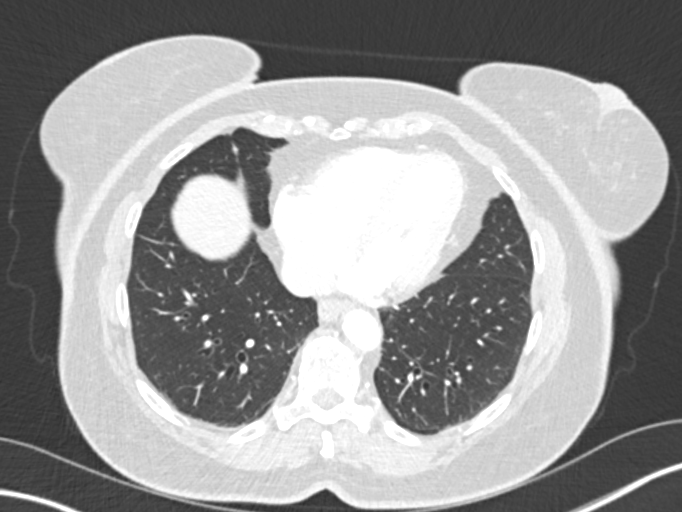
[im 78/145  mediastinal]
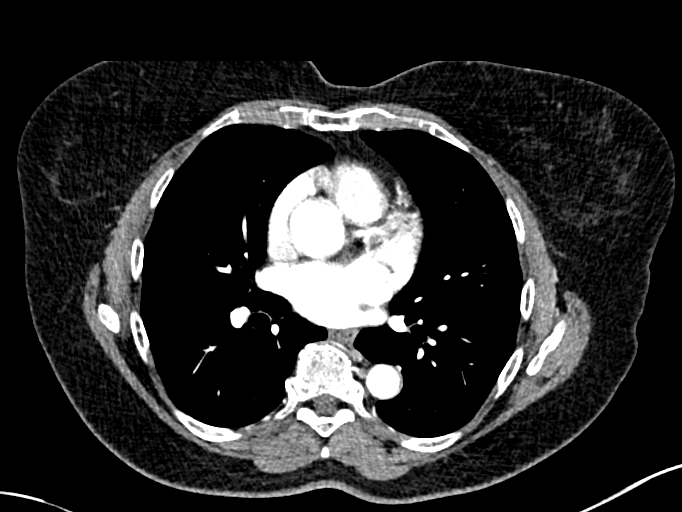
[im 78/145  lung]
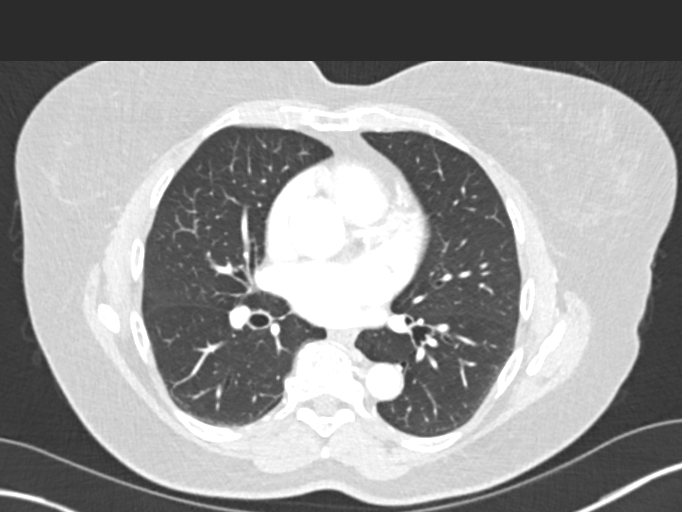
[im 89/145  lung]
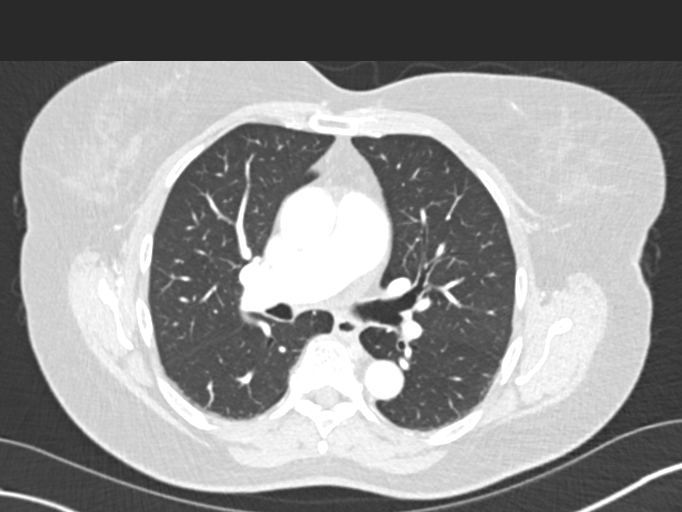
[im 100/145  lung]
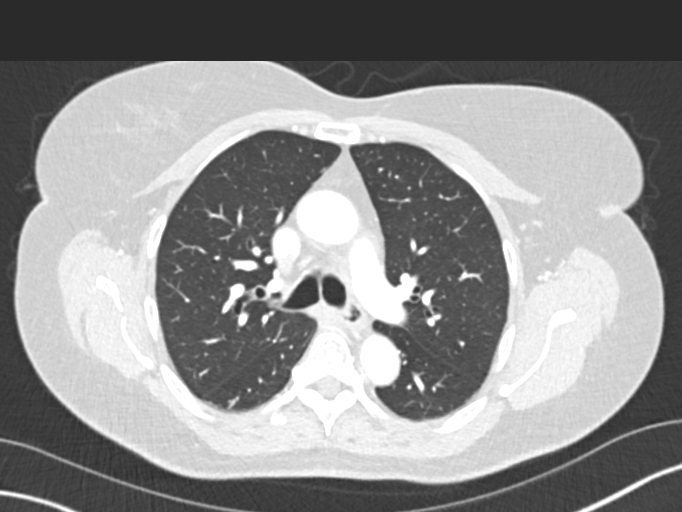
[im 122/145  lung]
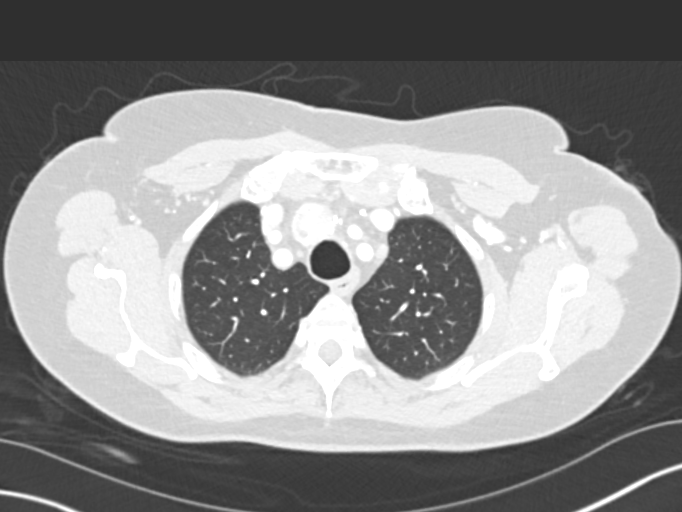
[im 133/145  mediastinal]
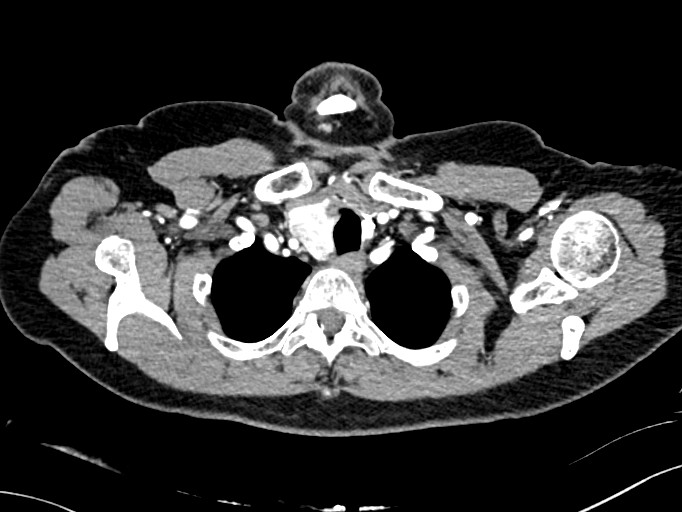
[im 133/145  lung]
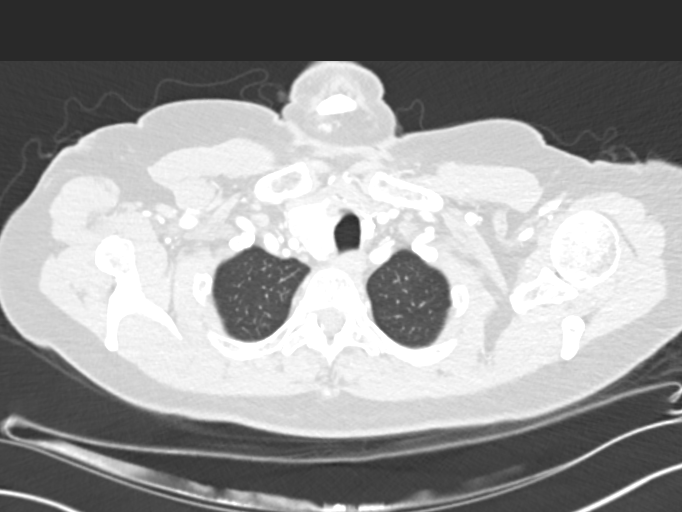

[Series 4: chest 2.00 br40 s3 cor · coronal · 0.57mm/px · 3 of 148 slices shown]
[im 30/148  lung]
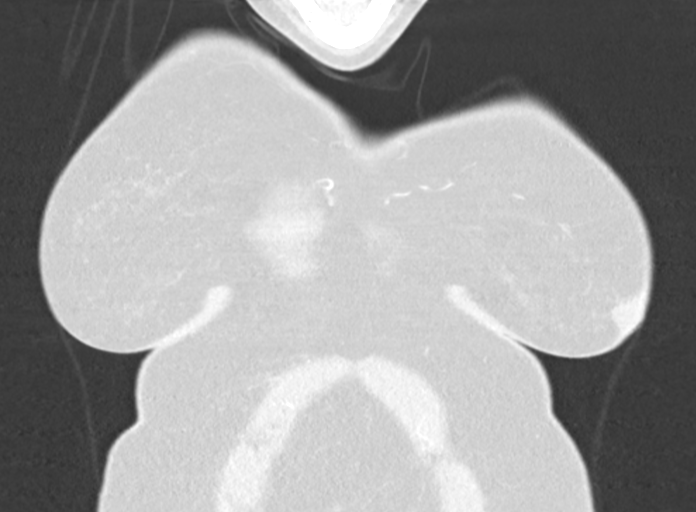
[im 59/148  lung]
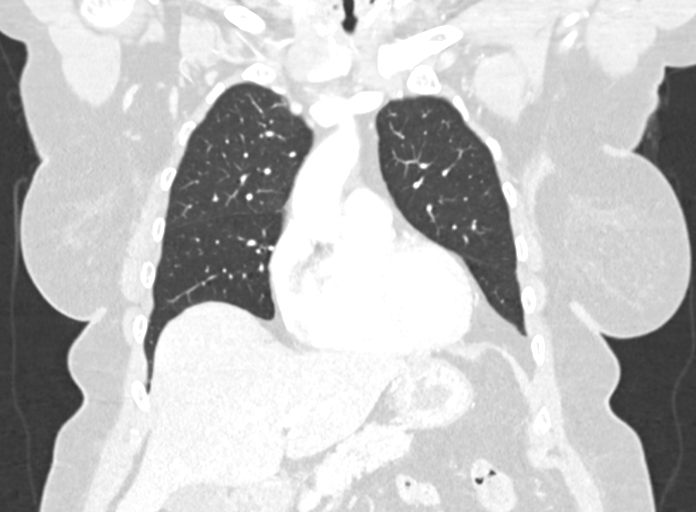
[im 89/148  lung]
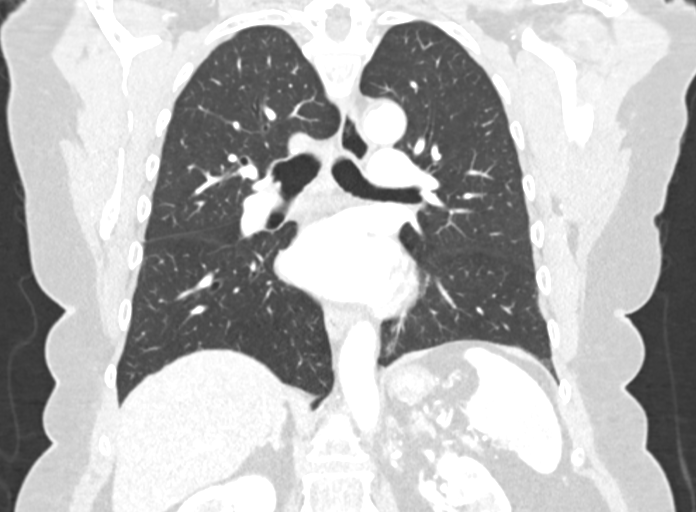

[12 of 36 positions shown; findings below may reference images not displayed]

FINDINGS: Cardiovascular: Scattered aortic calcifications. Heart is normal
size. Aorta is normal caliber.

Mediastinum/Nodes: No mediastinal, hilar, or axillary adenopathy.
Prior left thyroidectomy. Numerous nodules throughout the right
thyroid lobe, the largest 2.6 cm.

Lungs/Pleura: Lungs are clear. No focal airspace opacities or
suspicious nodules. No effusions.

Upper Abdomen: Imaging into the upper abdomen shows no acute
findings.

Musculoskeletal: Chest wall soft tissues are unremarkable. No acute
bony abnormality.
IMPRESSION: No acute cardiopulmonary disease.

Prior left thyroidectomy with numerous nodules throughout the right
thyroid lobe, likely multinodular goiter.

Aortic Atherosclerosis (8J1C3-V99.9).

## 2020-04-10 ENCOUNTER — Encounter: Payer: Medicare PPO | Attending: Family Medicine | Admitting: Dietician

## 2020-04-10 ENCOUNTER — Encounter: Payer: Self-pay | Admitting: Dietician

## 2020-04-10 ENCOUNTER — Other Ambulatory Visit: Payer: Self-pay

## 2020-04-10 DIAGNOSIS — E782 Mixed hyperlipidemia: Secondary | ICD-10-CM

## 2020-04-10 NOTE — Progress Notes (Signed)
Medical Nutrition Therapy   Primary concerns today: high cholesterol and prediabetes   Referral diagnosis: E78.2- mixed hyperlipidemia  Preferred learning style: visual Learning readiness: ready   NUTRITION ASSESSMENT   Anthropometrics  Weight: 150.8 lbs   Clinical Medical Hx: pre-diabetes, hypothyroid, hyperlipidemia, GERD, HTN Medications: see list  Labs: A1c 6.3% (June 2021)  Notable Signs/Symptoms: none identified   Lifestyle & Dietary Hx Noticed increased snacking due to stress over the past year related to [place]. Typical meal pattern is 3 meals per day, states she usually does snack but may have nuts. Overall does not avoid any particular food groups. States she is fairly comfortable with her weight but would like to lose 5-8 lbs. Walks throughout the week.   24-Hr Dietary Recall First Meal: plain [place] yogurt + walnuts + cinnamon + fruit (or egg + slice bacon + slice toast + fruit)  Snack: nuts Second Meal: [employer] cobb salad (or [employer]fil[employer] sandwich) (or bagel wrapped hot dog) (or sandwich made with low calorie bread + [place] + cheese + lettuce/tomato)  Snack: - Third Meal: baked chicken + green beans + sweet potatoes (or salmon) (or pork chop)  Snack: - Beverages: water, zero calorie flavored water   NUTRITION DIAGNOSIS  Excessive oral intake (NI-2.2) related to increased snacking as evidenced by patient reported dietary intake.    NUTRITION INTERVENTION  Nutrition education (E-1) on the following topics:  . High cholesterol management  . Blood sugar management   Handouts Provided Include   Heart Healthy Nutrition Therapy   Meal Ideas   Balanced Snacks   Learning Style & Readiness for Change Teaching method utilized: Visual & Auditory  Demonstrated degree of understanding via: Teach Back  Barriers to learning/adherence to lifestyle change: None Identified   Goals Established by Pt . Maintain physical activity throughout the week  . Keep a balance  of foods, focusing on plenty of fiber  . Look into the support group at [place] intake, weekly physical activity, and goals in 6 weeks.  Next Steps  Patient is to return to [facility] for follow up visit in 6 weeks.

## 2020-04-10 NOTE — Patient Instructions (Signed)
·   Maintain your physical activity throughout the week, great job with walking!  Remember to keep a balance of foods in your diet. Pay attention to the types of foods you are eating to get plenty of fiber from whole grains, fruits, vegetables, and beans.   For more info about the support group email Angela at [email or web address]

## 2020-04-11 DIAGNOSIS — E559 Vitamin D deficiency, unspecified: Secondary | ICD-10-CM | POA: Diagnosis not present

## 2020-04-11 DIAGNOSIS — R7301 Impaired fasting glucose: Secondary | ICD-10-CM | POA: Diagnosis not present

## 2020-04-11 DIAGNOSIS — E039 Hypothyroidism, unspecified: Secondary | ICD-10-CM | POA: Diagnosis not present

## 2020-04-11 DIAGNOSIS — D352 Benign neoplasm of pituitary gland: Secondary | ICD-10-CM | POA: Diagnosis not present

## 2020-04-11 DIAGNOSIS — E782 Mixed hyperlipidemia: Secondary | ICD-10-CM | POA: Insufficient documentation

## 2020-04-18 DIAGNOSIS — E039 Hypothyroidism, unspecified: Secondary | ICD-10-CM | POA: Diagnosis not present

## 2020-04-18 DIAGNOSIS — E236 Other disorders of pituitary gland: Secondary | ICD-10-CM | POA: Diagnosis not present

## 2020-04-18 DIAGNOSIS — E559 Vitamin D deficiency, unspecified: Secondary | ICD-10-CM | POA: Diagnosis not present

## 2020-04-18 DIAGNOSIS — R7301 Impaired fasting glucose: Secondary | ICD-10-CM | POA: Diagnosis not present

## 2020-04-19 DIAGNOSIS — H1045 Other chronic allergic conjunctivitis: Secondary | ICD-10-CM | POA: Diagnosis not present

## 2020-04-19 DIAGNOSIS — J4530 Mild persistent asthma, uncomplicated: Secondary | ICD-10-CM | POA: Diagnosis not present

## 2020-04-19 DIAGNOSIS — J3081 Allergic rhinitis due to animal (cat) (dog) hair and dander: Secondary | ICD-10-CM | POA: Diagnosis not present

## 2020-04-20 DIAGNOSIS — H16143 Punctate keratitis, bilateral: Secondary | ICD-10-CM | POA: Diagnosis not present

## 2020-04-20 DIAGNOSIS — H16223 Keratoconjunctivitis sicca, not specified as Sjogren's, bilateral: Secondary | ICD-10-CM | POA: Diagnosis not present

## 2020-05-03 DIAGNOSIS — H25813 Combined forms of age-related cataract, bilateral: Secondary | ICD-10-CM | POA: Diagnosis not present

## 2020-05-03 DIAGNOSIS — H0288B Meibomian gland dysfunction left eye, upper and lower eyelids: Secondary | ICD-10-CM | POA: Diagnosis not present

## 2020-05-03 DIAGNOSIS — H0288A Meibomian gland dysfunction right eye, upper and lower eyelids: Secondary | ICD-10-CM | POA: Diagnosis not present

## 2020-05-03 DIAGNOSIS — H16223 Keratoconjunctivitis sicca, not specified as Sjogren's, bilateral: Secondary | ICD-10-CM | POA: Diagnosis not present

## 2020-05-22 DIAGNOSIS — M810 Age-related osteoporosis without current pathological fracture: Secondary | ICD-10-CM | POA: Diagnosis not present

## 2020-05-23 ENCOUNTER — Ambulatory Visit: Payer: Medicare PPO | Admitting: Dietician

## 2020-05-31 DIAGNOSIS — E785 Hyperlipidemia, unspecified: Secondary | ICD-10-CM | POA: Diagnosis not present

## 2020-05-31 DIAGNOSIS — Z7984 Long term (current) use of oral hypoglycemic drugs: Secondary | ICD-10-CM | POA: Diagnosis not present

## 2020-05-31 DIAGNOSIS — E1169 Type 2 diabetes mellitus with other specified complication: Secondary | ICD-10-CM | POA: Diagnosis not present

## 2020-05-31 DIAGNOSIS — M25511 Pain in right shoulder: Secondary | ICD-10-CM | POA: Diagnosis not present

## 2020-05-31 DIAGNOSIS — E039 Hypothyroidism, unspecified: Secondary | ICD-10-CM | POA: Diagnosis not present

## 2020-06-06 DIAGNOSIS — M7531 Calcific tendinitis of right shoulder: Secondary | ICD-10-CM | POA: Diagnosis not present

## 2020-06-06 DIAGNOSIS — M7521 Bicipital tendinitis, right shoulder: Secondary | ICD-10-CM | POA: Diagnosis not present

## 2020-06-06 DIAGNOSIS — M533 Sacrococcygeal disorders, not elsewhere classified: Secondary | ICD-10-CM | POA: Diagnosis not present

## 2020-06-07 DIAGNOSIS — H33193 Other retinoschisis and retinal cysts, bilateral: Secondary | ICD-10-CM | POA: Diagnosis not present

## 2020-06-07 DIAGNOSIS — H2513 Age-related nuclear cataract, bilateral: Secondary | ICD-10-CM | POA: Diagnosis not present

## 2020-06-07 DIAGNOSIS — H43823 Vitreomacular adhesion, bilateral: Secondary | ICD-10-CM | POA: Diagnosis not present

## 2020-06-07 DIAGNOSIS — E119 Type 2 diabetes mellitus without complications: Secondary | ICD-10-CM | POA: Diagnosis not present

## 2020-06-07 DIAGNOSIS — Z6826 Body mass index (BMI) 26.0-26.9, adult: Secondary | ICD-10-CM | POA: Diagnosis not present

## 2020-06-07 DIAGNOSIS — Z779 Other contact with and (suspected) exposures hazardous to health: Secondary | ICD-10-CM | POA: Diagnosis not present

## 2020-06-07 DIAGNOSIS — Z124 Encounter for screening for malignant neoplasm of cervix: Secondary | ICD-10-CM | POA: Diagnosis not present

## 2020-06-16 DIAGNOSIS — M25511 Pain in right shoulder: Secondary | ICD-10-CM | POA: Diagnosis not present

## 2020-06-20 DIAGNOSIS — M25511 Pain in right shoulder: Secondary | ICD-10-CM | POA: Diagnosis not present

## 2020-06-22 DIAGNOSIS — M25511 Pain in right shoulder: Secondary | ICD-10-CM | POA: Diagnosis not present

## 2020-06-26 DIAGNOSIS — M25511 Pain in right shoulder: Secondary | ICD-10-CM | POA: Diagnosis not present

## 2020-06-29 DIAGNOSIS — M25511 Pain in right shoulder: Secondary | ICD-10-CM | POA: Diagnosis not present

## 2020-06-30 DIAGNOSIS — Z03818 Encounter for observation for suspected exposure to other biological agents ruled out: Secondary | ICD-10-CM | POA: Diagnosis not present

## 2020-06-30 DIAGNOSIS — Z1152 Encounter for screening for COVID-19: Secondary | ICD-10-CM | POA: Diagnosis not present

## 2020-07-06 DIAGNOSIS — M25511 Pain in right shoulder: Secondary | ICD-10-CM | POA: Diagnosis not present

## 2020-07-11 DIAGNOSIS — M25511 Pain in right shoulder: Secondary | ICD-10-CM | POA: Diagnosis not present

## 2020-07-17 DIAGNOSIS — M25511 Pain in right shoulder: Secondary | ICD-10-CM | POA: Diagnosis not present

## 2020-07-19 DIAGNOSIS — M25511 Pain in right shoulder: Secondary | ICD-10-CM | POA: Diagnosis not present

## 2020-07-26 DIAGNOSIS — M25511 Pain in right shoulder: Secondary | ICD-10-CM | POA: Diagnosis not present

## 2020-07-31 DIAGNOSIS — M25511 Pain in right shoulder: Secondary | ICD-10-CM | POA: Diagnosis not present

## 2020-08-02 DIAGNOSIS — M25511 Pain in right shoulder: Secondary | ICD-10-CM | POA: Diagnosis not present

## 2020-08-07 DIAGNOSIS — Z1152 Encounter for screening for COVID-19: Secondary | ICD-10-CM | POA: Diagnosis not present

## 2020-08-07 DIAGNOSIS — Z03818 Encounter for observation for suspected exposure to other biological agents ruled out: Secondary | ICD-10-CM | POA: Diagnosis not present

## 2020-08-08 DIAGNOSIS — M25511 Pain in right shoulder: Secondary | ICD-10-CM | POA: Diagnosis not present

## 2020-08-09 DIAGNOSIS — E119 Type 2 diabetes mellitus without complications: Secondary | ICD-10-CM | POA: Diagnosis not present

## 2020-08-09 DIAGNOSIS — H43823 Vitreomacular adhesion, bilateral: Secondary | ICD-10-CM | POA: Diagnosis not present

## 2020-08-09 DIAGNOSIS — H25813 Combined forms of age-related cataract, bilateral: Secondary | ICD-10-CM | POA: Diagnosis not present

## 2020-08-09 DIAGNOSIS — H33193 Other retinoschisis and retinal cysts, bilateral: Secondary | ICD-10-CM | POA: Diagnosis not present

## 2020-08-16 DIAGNOSIS — M25511 Pain in right shoulder: Secondary | ICD-10-CM | POA: Diagnosis not present

## 2020-10-10 DIAGNOSIS — R7301 Impaired fasting glucose: Secondary | ICD-10-CM | POA: Diagnosis not present

## 2020-10-10 DIAGNOSIS — E7800 Pure hypercholesterolemia, unspecified: Secondary | ICD-10-CM | POA: Diagnosis not present

## 2020-10-10 DIAGNOSIS — E039 Hypothyroidism, unspecified: Secondary | ICD-10-CM | POA: Diagnosis not present

## 2020-10-31 DIAGNOSIS — D352 Benign neoplasm of pituitary gland: Secondary | ICD-10-CM | POA: Diagnosis not present

## 2020-10-31 DIAGNOSIS — R7301 Impaired fasting glucose: Secondary | ICD-10-CM | POA: Diagnosis not present

## 2020-10-31 DIAGNOSIS — E559 Vitamin D deficiency, unspecified: Secondary | ICD-10-CM | POA: Diagnosis not present

## 2020-10-31 DIAGNOSIS — E236 Other disorders of pituitary gland: Secondary | ICD-10-CM | POA: Diagnosis not present

## 2020-10-31 DIAGNOSIS — E7800 Pure hypercholesterolemia, unspecified: Secondary | ICD-10-CM | POA: Diagnosis not present

## 2020-10-31 DIAGNOSIS — M810 Age-related osteoporosis without current pathological fracture: Secondary | ICD-10-CM | POA: Diagnosis not present

## 2020-10-31 DIAGNOSIS — E039 Hypothyroidism, unspecified: Secondary | ICD-10-CM | POA: Diagnosis not present

## 2020-11-01 DIAGNOSIS — H25813 Combined forms of age-related cataract, bilateral: Secondary | ICD-10-CM | POA: Diagnosis not present

## 2020-11-01 DIAGNOSIS — H4051X2 Glaucoma secondary to other eye disorders, right eye, moderate stage: Secondary | ICD-10-CM | POA: Diagnosis not present

## 2020-11-01 DIAGNOSIS — H4052X1 Glaucoma secondary to other eye disorders, left eye, mild stage: Secondary | ICD-10-CM | POA: Diagnosis not present

## 2020-11-02 DIAGNOSIS — R400 Somnolence: Secondary | ICD-10-CM | POA: Diagnosis not present

## 2020-11-02 DIAGNOSIS — R030 Elevated blood-pressure reading, without diagnosis of hypertension: Secondary | ICD-10-CM | POA: Diagnosis not present

## 2020-11-02 DIAGNOSIS — E1169 Type 2 diabetes mellitus with other specified complication: Secondary | ICD-10-CM | POA: Diagnosis not present

## 2020-11-02 DIAGNOSIS — E782 Mixed hyperlipidemia: Secondary | ICD-10-CM | POA: Diagnosis not present

## 2020-11-02 DIAGNOSIS — M2550 Pain in unspecified joint: Secondary | ICD-10-CM | POA: Diagnosis not present

## 2020-11-02 DIAGNOSIS — E039 Hypothyroidism, unspecified: Secondary | ICD-10-CM | POA: Diagnosis not present

## 2020-11-02 DIAGNOSIS — K219 Gastro-esophageal reflux disease without esophagitis: Secondary | ICD-10-CM | POA: Diagnosis not present

## 2020-11-10 DIAGNOSIS — G4719 Other hypersomnia: Secondary | ICD-10-CM | POA: Diagnosis not present

## 2020-11-10 DIAGNOSIS — E039 Hypothyroidism, unspecified: Secondary | ICD-10-CM | POA: Diagnosis not present

## 2020-11-20 DIAGNOSIS — F5104 Psychophysiologic insomnia: Secondary | ICD-10-CM | POA: Diagnosis not present

## 2020-11-20 DIAGNOSIS — G4733 Obstructive sleep apnea (adult) (pediatric): Secondary | ICD-10-CM | POA: Diagnosis not present

## 2020-11-20 DIAGNOSIS — E039 Hypothyroidism, unspecified: Secondary | ICD-10-CM | POA: Diagnosis not present

## 2020-11-23 DIAGNOSIS — G4733 Obstructive sleep apnea (adult) (pediatric): Secondary | ICD-10-CM | POA: Diagnosis not present

## 2020-11-29 DIAGNOSIS — M65331 Trigger finger, right middle finger: Secondary | ICD-10-CM | POA: Diagnosis not present

## 2020-11-29 DIAGNOSIS — M3500 Sicca syndrome, unspecified: Secondary | ICD-10-CM | POA: Diagnosis not present

## 2020-11-29 DIAGNOSIS — R5383 Other fatigue: Secondary | ICD-10-CM | POA: Diagnosis not present

## 2020-11-29 DIAGNOSIS — Z6826 Body mass index (BMI) 26.0-26.9, adult: Secondary | ICD-10-CM | POA: Diagnosis not present

## 2020-11-29 DIAGNOSIS — M545 Low back pain, unspecified: Secondary | ICD-10-CM | POA: Diagnosis not present

## 2020-11-29 DIAGNOSIS — M2550 Pain in unspecified joint: Secondary | ICD-10-CM | POA: Diagnosis not present

## 2020-11-29 DIAGNOSIS — M159 Polyosteoarthritis, unspecified: Secondary | ICD-10-CM | POA: Diagnosis not present

## 2020-11-29 DIAGNOSIS — M539 Dorsopathy, unspecified: Secondary | ICD-10-CM | POA: Diagnosis not present

## 2020-11-29 DIAGNOSIS — E663 Overweight: Secondary | ICD-10-CM | POA: Diagnosis not present

## 2020-12-11 DIAGNOSIS — H4052X1 Glaucoma secondary to other eye disorders, left eye, mild stage: Secondary | ICD-10-CM | POA: Diagnosis not present

## 2020-12-20 DIAGNOSIS — H4052X1 Glaucoma secondary to other eye disorders, left eye, mild stage: Secondary | ICD-10-CM | POA: Diagnosis not present

## 2020-12-20 DIAGNOSIS — E1139 Type 2 diabetes mellitus with other diabetic ophthalmic complication: Secondary | ICD-10-CM | POA: Diagnosis not present

## 2020-12-20 DIAGNOSIS — Z90710 Acquired absence of both cervix and uterus: Secondary | ICD-10-CM | POA: Diagnosis not present

## 2020-12-20 DIAGNOSIS — E1136 Type 2 diabetes mellitus with diabetic cataract: Secondary | ICD-10-CM | POA: Diagnosis not present

## 2020-12-20 DIAGNOSIS — I10 Essential (primary) hypertension: Secondary | ICD-10-CM | POA: Diagnosis not present

## 2020-12-20 DIAGNOSIS — G4730 Sleep apnea, unspecified: Secondary | ICD-10-CM | POA: Diagnosis not present

## 2020-12-20 DIAGNOSIS — Z885 Allergy status to narcotic agent status: Secondary | ICD-10-CM | POA: Diagnosis not present

## 2020-12-20 DIAGNOSIS — J45909 Unspecified asthma, uncomplicated: Secondary | ICD-10-CM | POA: Diagnosis not present

## 2020-12-20 DIAGNOSIS — H25812 Combined forms of age-related cataract, left eye: Secondary | ICD-10-CM | POA: Diagnosis not present

## 2020-12-20 DIAGNOSIS — E039 Hypothyroidism, unspecified: Secondary | ICD-10-CM | POA: Diagnosis not present

## 2021-01-01 DIAGNOSIS — M810 Age-related osteoporosis without current pathological fracture: Secondary | ICD-10-CM | POA: Diagnosis not present

## 2021-01-01 DIAGNOSIS — M2550 Pain in unspecified joint: Secondary | ICD-10-CM | POA: Diagnosis not present

## 2021-01-01 DIAGNOSIS — Z6826 Body mass index (BMI) 26.0-26.9, adult: Secondary | ICD-10-CM | POA: Diagnosis not present

## 2021-01-01 DIAGNOSIS — M65331 Trigger finger, right middle finger: Secondary | ICD-10-CM | POA: Diagnosis not present

## 2021-01-01 DIAGNOSIS — E663 Overweight: Secondary | ICD-10-CM | POA: Diagnosis not present

## 2021-01-01 DIAGNOSIS — M545 Low back pain, unspecified: Secondary | ICD-10-CM | POA: Diagnosis not present

## 2021-01-01 DIAGNOSIS — M159 Polyosteoarthritis, unspecified: Secondary | ICD-10-CM | POA: Diagnosis not present

## 2021-01-02 DIAGNOSIS — E039 Hypothyroidism, unspecified: Secondary | ICD-10-CM | POA: Diagnosis not present

## 2021-01-05 DIAGNOSIS — I10 Essential (primary) hypertension: Secondary | ICD-10-CM | POA: Diagnosis not present

## 2021-01-05 DIAGNOSIS — M810 Age-related osteoporosis without current pathological fracture: Secondary | ICD-10-CM | POA: Diagnosis not present

## 2021-01-05 DIAGNOSIS — L230 Allergic contact dermatitis due to metals: Secondary | ICD-10-CM | POA: Diagnosis not present

## 2021-01-17 DIAGNOSIS — H33193 Other retinoschisis and retinal cysts, bilateral: Secondary | ICD-10-CM | POA: Diagnosis not present

## 2021-01-17 DIAGNOSIS — H43823 Vitreomacular adhesion, bilateral: Secondary | ICD-10-CM | POA: Diagnosis not present

## 2021-01-17 DIAGNOSIS — H2511 Age-related nuclear cataract, right eye: Secondary | ICD-10-CM | POA: Diagnosis not present

## 2021-01-17 DIAGNOSIS — H26492 Other secondary cataract, left eye: Secondary | ICD-10-CM | POA: Diagnosis not present

## 2021-02-13 DIAGNOSIS — E538 Deficiency of other specified B group vitamins: Secondary | ICD-10-CM | POA: Diagnosis not present

## 2021-02-13 DIAGNOSIS — Z1389 Encounter for screening for other disorder: Secondary | ICD-10-CM | POA: Diagnosis not present

## 2021-02-13 DIAGNOSIS — Z7984 Long term (current) use of oral hypoglycemic drugs: Secondary | ICD-10-CM | POA: Diagnosis not present

## 2021-02-13 DIAGNOSIS — M810 Age-related osteoporosis without current pathological fracture: Secondary | ICD-10-CM | POA: Diagnosis not present

## 2021-02-13 DIAGNOSIS — I10 Essential (primary) hypertension: Secondary | ICD-10-CM | POA: Diagnosis not present

## 2021-02-13 DIAGNOSIS — E1169 Type 2 diabetes mellitus with other specified complication: Secondary | ICD-10-CM | POA: Diagnosis not present

## 2021-02-13 DIAGNOSIS — Z0000 Encounter for general adult medical examination without abnormal findings: Secondary | ICD-10-CM | POA: Diagnosis not present

## 2021-02-13 DIAGNOSIS — E039 Hypothyroidism, unspecified: Secondary | ICD-10-CM | POA: Diagnosis not present

## 2021-02-13 DIAGNOSIS — E785 Hyperlipidemia, unspecified: Secondary | ICD-10-CM | POA: Diagnosis not present

## 2021-02-22 DIAGNOSIS — Z1231 Encounter for screening mammogram for malignant neoplasm of breast: Secondary | ICD-10-CM | POA: Diagnosis not present

## 2021-02-28 DIAGNOSIS — Z90710 Acquired absence of both cervix and uterus: Secondary | ICD-10-CM | POA: Diagnosis not present

## 2021-02-28 DIAGNOSIS — I10 Essential (primary) hypertension: Secondary | ICD-10-CM | POA: Diagnosis not present

## 2021-02-28 DIAGNOSIS — H25811 Combined forms of age-related cataract, right eye: Secondary | ICD-10-CM | POA: Diagnosis not present

## 2021-02-28 DIAGNOSIS — Z885 Allergy status to narcotic agent status: Secondary | ICD-10-CM | POA: Diagnosis not present

## 2021-02-28 DIAGNOSIS — E1136 Type 2 diabetes mellitus with diabetic cataract: Secondary | ICD-10-CM | POA: Diagnosis not present

## 2021-02-28 DIAGNOSIS — G4730 Sleep apnea, unspecified: Secondary | ICD-10-CM | POA: Diagnosis not present

## 2021-02-28 DIAGNOSIS — J45909 Unspecified asthma, uncomplicated: Secondary | ICD-10-CM | POA: Diagnosis not present

## 2021-02-28 DIAGNOSIS — H4051X1 Glaucoma secondary to other eye disorders, right eye, mild stage: Secondary | ICD-10-CM | POA: Diagnosis not present

## 2021-02-28 DIAGNOSIS — E039 Hypothyroidism, unspecified: Secondary | ICD-10-CM | POA: Diagnosis not present

## 2021-02-28 DIAGNOSIS — H4051X2 Glaucoma secondary to other eye disorders, right eye, moderate stage: Secondary | ICD-10-CM | POA: Diagnosis not present

## 2021-03-15 DIAGNOSIS — G4733 Obstructive sleep apnea (adult) (pediatric): Secondary | ICD-10-CM | POA: Diagnosis not present

## 2021-03-27 DIAGNOSIS — G4733 Obstructive sleep apnea (adult) (pediatric): Secondary | ICD-10-CM | POA: Diagnosis not present

## 2021-04-15 DIAGNOSIS — G4733 Obstructive sleep apnea (adult) (pediatric): Secondary | ICD-10-CM | POA: Diagnosis not present

## 2021-04-27 DIAGNOSIS — J4530 Mild persistent asthma, uncomplicated: Secondary | ICD-10-CM | POA: Diagnosis not present

## 2021-04-27 DIAGNOSIS — J3081 Allergic rhinitis due to animal (cat) (dog) hair and dander: Secondary | ICD-10-CM | POA: Diagnosis not present

## 2021-04-27 DIAGNOSIS — H1045 Other chronic allergic conjunctivitis: Secondary | ICD-10-CM | POA: Diagnosis not present

## 2021-04-27 DIAGNOSIS — G4733 Obstructive sleep apnea (adult) (pediatric): Secondary | ICD-10-CM | POA: Diagnosis not present

## 2021-05-01 DIAGNOSIS — M25511 Pain in right shoulder: Secondary | ICD-10-CM | POA: Diagnosis not present

## 2021-05-01 DIAGNOSIS — M65331 Trigger finger, right middle finger: Secondary | ICD-10-CM | POA: Diagnosis not present

## 2021-05-16 ENCOUNTER — Ambulatory Visit
Admission: RE | Admit: 2021-05-16 | Discharge: 2021-05-16 | Disposition: A | Discharge status: Home / Self Care | Payer: Medicare PPO | Source: Ambulatory Visit | Attending: Sports Medicine | Admitting: Sports Medicine

## 2021-05-16 ENCOUNTER — Other Ambulatory Visit: Payer: Self-pay | Admitting: Sports Medicine

## 2021-05-16 DIAGNOSIS — G4733 Obstructive sleep apnea (adult) (pediatric): Secondary | ICD-10-CM | POA: Diagnosis not present

## 2021-05-16 DIAGNOSIS — M25511 Pain in right shoulder: Secondary | ICD-10-CM

## 2021-05-17 DIAGNOSIS — I10 Essential (primary) hypertension: Secondary | ICD-10-CM | POA: Diagnosis not present

## 2021-05-17 DIAGNOSIS — G4733 Obstructive sleep apnea (adult) (pediatric): Secondary | ICD-10-CM | POA: Diagnosis not present

## 2021-05-27 DIAGNOSIS — G4733 Obstructive sleep apnea (adult) (pediatric): Secondary | ICD-10-CM | POA: Diagnosis not present

## 2021-05-29 DIAGNOSIS — M25511 Pain in right shoulder: Secondary | ICD-10-CM | POA: Diagnosis not present

## 2021-06-15 DIAGNOSIS — G4733 Obstructive sleep apnea (adult) (pediatric): Secondary | ICD-10-CM | POA: Diagnosis not present

## 2021-06-19 DIAGNOSIS — M25511 Pain in right shoulder: Secondary | ICD-10-CM | POA: Diagnosis not present

## 2021-06-20 DIAGNOSIS — M810 Age-related osteoporosis without current pathological fracture: Secondary | ICD-10-CM | POA: Diagnosis not present

## 2021-06-26 DIAGNOSIS — M25511 Pain in right shoulder: Secondary | ICD-10-CM | POA: Diagnosis not present

## 2021-06-29 DIAGNOSIS — M25511 Pain in right shoulder: Secondary | ICD-10-CM | POA: Diagnosis not present

## 2021-07-02 DIAGNOSIS — M25562 Pain in left knee: Secondary | ICD-10-CM | POA: Diagnosis not present

## 2021-07-02 DIAGNOSIS — R0781 Pleurodynia: Secondary | ICD-10-CM | POA: Diagnosis not present

## 2021-07-03 DIAGNOSIS — M25511 Pain in right shoulder: Secondary | ICD-10-CM | POA: Diagnosis not present

## 2021-07-06 DIAGNOSIS — M25511 Pain in right shoulder: Secondary | ICD-10-CM | POA: Diagnosis not present

## 2021-07-09 DIAGNOSIS — M25511 Pain in right shoulder: Secondary | ICD-10-CM | POA: Diagnosis not present

## 2021-07-16 DIAGNOSIS — M25511 Pain in right shoulder: Secondary | ICD-10-CM | POA: Diagnosis not present

## 2021-07-18 DIAGNOSIS — M25511 Pain in right shoulder: Secondary | ICD-10-CM | POA: Diagnosis not present

## 2021-07-23 DIAGNOSIS — H4051X3 Glaucoma secondary to other eye disorders, right eye, severe stage: Secondary | ICD-10-CM | POA: Diagnosis not present

## 2021-07-23 DIAGNOSIS — H04123 Dry eye syndrome of bilateral lacrimal glands: Secondary | ICD-10-CM | POA: Diagnosis not present

## 2021-07-23 DIAGNOSIS — H26493 Other secondary cataract, bilateral: Secondary | ICD-10-CM | POA: Diagnosis not present

## 2021-07-23 DIAGNOSIS — H4052X1 Glaucoma secondary to other eye disorders, left eye, mild stage: Secondary | ICD-10-CM | POA: Diagnosis not present

## 2021-07-24 DIAGNOSIS — R002 Palpitations: Secondary | ICD-10-CM | POA: Diagnosis not present

## 2021-07-24 DIAGNOSIS — E1169 Type 2 diabetes mellitus with other specified complication: Secondary | ICD-10-CM | POA: Diagnosis not present

## 2021-07-24 DIAGNOSIS — Z7984 Long term (current) use of oral hypoglycemic drugs: Secondary | ICD-10-CM | POA: Diagnosis not present

## 2021-07-24 DIAGNOSIS — R000 Tachycardia, unspecified: Secondary | ICD-10-CM | POA: Diagnosis not present

## 2021-07-24 DIAGNOSIS — M25511 Pain in right shoulder: Secondary | ICD-10-CM | POA: Diagnosis not present

## 2021-07-25 DIAGNOSIS — H43821 Vitreomacular adhesion, right eye: Secondary | ICD-10-CM | POA: Diagnosis not present

## 2021-07-25 DIAGNOSIS — E119 Type 2 diabetes mellitus without complications: Secondary | ICD-10-CM | POA: Diagnosis not present

## 2021-07-25 DIAGNOSIS — H33193 Other retinoschisis and retinal cysts, bilateral: Secondary | ICD-10-CM | POA: Diagnosis not present

## 2021-07-26 DIAGNOSIS — M25511 Pain in right shoulder: Secondary | ICD-10-CM | POA: Diagnosis not present

## 2021-07-30 DIAGNOSIS — M25511 Pain in right shoulder: Secondary | ICD-10-CM | POA: Diagnosis not present

## 2021-08-01 DIAGNOSIS — M25511 Pain in right shoulder: Secondary | ICD-10-CM | POA: Diagnosis not present

## 2021-08-07 DIAGNOSIS — J019 Acute sinusitis, unspecified: Secondary | ICD-10-CM | POA: Diagnosis not present

## 2021-08-08 DIAGNOSIS — M25511 Pain in right shoulder: Secondary | ICD-10-CM | POA: Diagnosis not present

## 2021-08-08 DIAGNOSIS — M810 Age-related osteoporosis without current pathological fracture: Secondary | ICD-10-CM | POA: Diagnosis not present

## 2021-08-22 DIAGNOSIS — M25511 Pain in right shoulder: Secondary | ICD-10-CM | POA: Diagnosis not present

## 2021-08-27 DIAGNOSIS — M25511 Pain in right shoulder: Secondary | ICD-10-CM | POA: Diagnosis not present

## 2021-08-28 DIAGNOSIS — G4733 Obstructive sleep apnea (adult) (pediatric): Secondary | ICD-10-CM | POA: Diagnosis not present

## 2021-08-29 DIAGNOSIS — M25511 Pain in right shoulder: Secondary | ICD-10-CM | POA: Diagnosis not present

## 2021-09-03 DIAGNOSIS — M25511 Pain in right shoulder: Secondary | ICD-10-CM | POA: Diagnosis not present

## 2021-09-05 DIAGNOSIS — M25511 Pain in right shoulder: Secondary | ICD-10-CM | POA: Diagnosis not present

## 2021-09-11 DIAGNOSIS — H4052X1 Glaucoma secondary to other eye disorders, left eye, mild stage: Secondary | ICD-10-CM | POA: Diagnosis not present

## 2021-09-11 DIAGNOSIS — H4051X3 Glaucoma secondary to other eye disorders, right eye, severe stage: Secondary | ICD-10-CM | POA: Diagnosis not present

## 2021-09-12 DIAGNOSIS — M810 Age-related osteoporosis without current pathological fracture: Secondary | ICD-10-CM | POA: Diagnosis not present

## 2021-09-17 DIAGNOSIS — M25511 Pain in right shoulder: Secondary | ICD-10-CM | POA: Diagnosis not present

## 2021-10-03 DIAGNOSIS — Z7984 Long term (current) use of oral hypoglycemic drugs: Secondary | ICD-10-CM | POA: Diagnosis not present

## 2021-10-03 DIAGNOSIS — I10 Essential (primary) hypertension: Secondary | ICD-10-CM | POA: Diagnosis not present

## 2021-10-03 DIAGNOSIS — E1169 Type 2 diabetes mellitus with other specified complication: Secondary | ICD-10-CM | POA: Diagnosis not present

## 2021-10-03 DIAGNOSIS — E782 Mixed hyperlipidemia: Secondary | ICD-10-CM | POA: Diagnosis not present

## 2021-10-03 DIAGNOSIS — E039 Hypothyroidism, unspecified: Secondary | ICD-10-CM | POA: Diagnosis not present

## 2021-10-03 DIAGNOSIS — M810 Age-related osteoporosis without current pathological fracture: Secondary | ICD-10-CM | POA: Diagnosis not present

## 2021-10-16 DIAGNOSIS — U071 COVID-19: Secondary | ICD-10-CM | POA: Diagnosis not present

## 2021-12-17 DIAGNOSIS — R7301 Impaired fasting glucose: Secondary | ICD-10-CM | POA: Diagnosis not present

## 2021-12-17 DIAGNOSIS — D352 Benign neoplasm of pituitary gland: Secondary | ICD-10-CM | POA: Diagnosis not present

## 2021-12-17 DIAGNOSIS — E039 Hypothyroidism, unspecified: Secondary | ICD-10-CM | POA: Diagnosis not present

## 2021-12-17 DIAGNOSIS — E559 Vitamin D deficiency, unspecified: Secondary | ICD-10-CM | POA: Diagnosis not present

## 2021-12-24 DIAGNOSIS — M810 Age-related osteoporosis without current pathological fracture: Secondary | ICD-10-CM | POA: Diagnosis not present

## 2021-12-24 DIAGNOSIS — E039 Hypothyroidism, unspecified: Secondary | ICD-10-CM | POA: Diagnosis not present

## 2021-12-24 DIAGNOSIS — E7800 Pure hypercholesterolemia, unspecified: Secondary | ICD-10-CM | POA: Diagnosis not present

## 2021-12-24 DIAGNOSIS — E559 Vitamin D deficiency, unspecified: Secondary | ICD-10-CM | POA: Diagnosis not present

## 2021-12-24 DIAGNOSIS — R7301 Impaired fasting glucose: Secondary | ICD-10-CM | POA: Diagnosis not present

## 2021-12-24 DIAGNOSIS — E236 Other disorders of pituitary gland: Secondary | ICD-10-CM | POA: Diagnosis not present

## 2022-01-23 DIAGNOSIS — H401132 Primary open-angle glaucoma, bilateral, moderate stage: Secondary | ICD-10-CM | POA: Diagnosis not present

## 2022-01-23 DIAGNOSIS — H33193 Other retinoschisis and retinal cysts, bilateral: Secondary | ICD-10-CM | POA: Diagnosis not present

## 2022-01-23 DIAGNOSIS — H43823 Vitreomacular adhesion, bilateral: Secondary | ICD-10-CM | POA: Diagnosis not present

## 2022-01-23 DIAGNOSIS — E119 Type 2 diabetes mellitus without complications: Secondary | ICD-10-CM | POA: Diagnosis not present

## 2022-02-11 DIAGNOSIS — H33103 Unspecified retinoschisis, bilateral: Secondary | ICD-10-CM | POA: Diagnosis not present

## 2022-02-11 DIAGNOSIS — H26493 Other secondary cataract, bilateral: Secondary | ICD-10-CM | POA: Diagnosis not present

## 2022-02-11 DIAGNOSIS — H43823 Vitreomacular adhesion, bilateral: Secondary | ICD-10-CM | POA: Diagnosis not present

## 2022-02-11 DIAGNOSIS — H4089 Other specified glaucoma: Secondary | ICD-10-CM | POA: Diagnosis not present

## 2022-02-11 DIAGNOSIS — H04123 Dry eye syndrome of bilateral lacrimal glands: Secondary | ICD-10-CM | POA: Diagnosis not present

## 2022-02-26 DIAGNOSIS — I10 Essential (primary) hypertension: Secondary | ICD-10-CM | POA: Diagnosis not present

## 2022-02-26 DIAGNOSIS — G4733 Obstructive sleep apnea (adult) (pediatric): Secondary | ICD-10-CM | POA: Diagnosis not present

## 2022-03-01 DIAGNOSIS — M810 Age-related osteoporosis without current pathological fracture: Secondary | ICD-10-CM | POA: Diagnosis not present

## 2022-03-07 DIAGNOSIS — N6320 Unspecified lump in the left breast, unspecified quadrant: Secondary | ICD-10-CM | POA: Diagnosis not present

## 2022-03-07 DIAGNOSIS — R928 Other abnormal and inconclusive findings on diagnostic imaging of breast: Secondary | ICD-10-CM | POA: Diagnosis not present

## 2022-03-12 DIAGNOSIS — R7303 Prediabetes: Secondary | ICD-10-CM | POA: Diagnosis not present

## 2022-03-12 DIAGNOSIS — E785 Hyperlipidemia, unspecified: Secondary | ICD-10-CM | POA: Diagnosis not present

## 2022-03-12 DIAGNOSIS — R5383 Other fatigue: Secondary | ICD-10-CM | POA: Diagnosis not present

## 2022-03-12 DIAGNOSIS — E039 Hypothyroidism, unspecified: Secondary | ICD-10-CM | POA: Diagnosis not present

## 2022-03-12 DIAGNOSIS — R202 Paresthesia of skin: Secondary | ICD-10-CM | POA: Diagnosis not present

## 2022-03-12 DIAGNOSIS — G4733 Obstructive sleep apnea (adult) (pediatric): Secondary | ICD-10-CM | POA: Diagnosis not present

## 2022-03-12 DIAGNOSIS — M810 Age-related osteoporosis without current pathological fracture: Secondary | ICD-10-CM | POA: Diagnosis not present

## 2022-03-12 DIAGNOSIS — Z1331 Encounter for screening for depression: Secondary | ICD-10-CM | POA: Diagnosis not present

## 2022-03-12 DIAGNOSIS — Z0000 Encounter for general adult medical examination without abnormal findings: Secondary | ICD-10-CM | POA: Diagnosis not present

## 2022-03-12 DIAGNOSIS — R800 Isolated proteinuria: Secondary | ICD-10-CM | POA: Diagnosis not present

## 2022-03-13 DIAGNOSIS — M810 Age-related osteoporosis without current pathological fracture: Secondary | ICD-10-CM | POA: Diagnosis not present

## 2022-03-21 DIAGNOSIS — H26493 Other secondary cataract, bilateral: Secondary | ICD-10-CM | POA: Diagnosis not present

## 2022-03-21 DIAGNOSIS — H33103 Unspecified retinoschisis, bilateral: Secondary | ICD-10-CM | POA: Diagnosis not present

## 2022-03-21 DIAGNOSIS — H43823 Vitreomacular adhesion, bilateral: Secondary | ICD-10-CM | POA: Diagnosis not present

## 2022-03-21 DIAGNOSIS — H04123 Dry eye syndrome of bilateral lacrimal glands: Secondary | ICD-10-CM | POA: Diagnosis not present

## 2022-03-21 DIAGNOSIS — H4089 Other specified glaucoma: Secondary | ICD-10-CM | POA: Diagnosis not present

## 2022-04-08 DIAGNOSIS — N958 Other specified menopausal and perimenopausal disorders: Secondary | ICD-10-CM | POA: Diagnosis not present

## 2022-04-08 DIAGNOSIS — R29890 Loss of height: Secondary | ICD-10-CM | POA: Diagnosis not present

## 2022-04-08 DIAGNOSIS — M8588 Other specified disorders of bone density and structure, other site: Secondary | ICD-10-CM | POA: Diagnosis not present

## 2022-04-18 DIAGNOSIS — G4733 Obstructive sleep apnea (adult) (pediatric): Secondary | ICD-10-CM | POA: Diagnosis not present

## 2022-04-18 DIAGNOSIS — I10 Essential (primary) hypertension: Secondary | ICD-10-CM | POA: Diagnosis not present

## 2022-05-01 DIAGNOSIS — E119 Type 2 diabetes mellitus without complications: Secondary | ICD-10-CM | POA: Diagnosis not present

## 2022-05-01 DIAGNOSIS — H33193 Other retinoschisis and retinal cysts, bilateral: Secondary | ICD-10-CM | POA: Diagnosis not present

## 2022-05-01 DIAGNOSIS — H401132 Primary open-angle glaucoma, bilateral, moderate stage: Secondary | ICD-10-CM | POA: Diagnosis not present

## 2022-05-01 DIAGNOSIS — H43823 Vitreomacular adhesion, bilateral: Secondary | ICD-10-CM | POA: Diagnosis not present

## 2022-05-15 DIAGNOSIS — H1045 Other chronic allergic conjunctivitis: Secondary | ICD-10-CM | POA: Diagnosis not present

## 2022-05-15 DIAGNOSIS — J3081 Allergic rhinitis due to animal (cat) (dog) hair and dander: Secondary | ICD-10-CM | POA: Diagnosis not present

## 2022-05-15 DIAGNOSIS — J4530 Mild persistent asthma, uncomplicated: Secondary | ICD-10-CM | POA: Diagnosis not present

## 2022-05-20 DIAGNOSIS — H26493 Other secondary cataract, bilateral: Secondary | ICD-10-CM | POA: Diagnosis not present

## 2022-05-20 DIAGNOSIS — H04123 Dry eye syndrome of bilateral lacrimal glands: Secondary | ICD-10-CM | POA: Diagnosis not present

## 2022-05-20 DIAGNOSIS — H43823 Vitreomacular adhesion, bilateral: Secondary | ICD-10-CM | POA: Diagnosis not present

## 2022-05-20 DIAGNOSIS — H33103 Unspecified retinoschisis, bilateral: Secondary | ICD-10-CM | POA: Diagnosis not present

## 2022-05-20 DIAGNOSIS — H4089 Other specified glaucoma: Secondary | ICD-10-CM | POA: Diagnosis not present

## 2022-06-10 ENCOUNTER — Encounter: Payer: Self-pay | Admitting: Gastroenterology

## 2022-06-13 DIAGNOSIS — R5383 Other fatigue: Secondary | ICD-10-CM | POA: Diagnosis not present

## 2022-06-13 DIAGNOSIS — Z124 Encounter for screening for malignant neoplasm of cervix: Secondary | ICD-10-CM | POA: Diagnosis not present

## 2022-06-13 DIAGNOSIS — Z6827 Body mass index (BMI) 27.0-27.9, adult: Secondary | ICD-10-CM | POA: Diagnosis not present

## 2022-06-13 DIAGNOSIS — Z1272 Encounter for screening for malignant neoplasm of vagina: Secondary | ICD-10-CM | POA: Diagnosis not present

## 2022-06-17 ENCOUNTER — Other Ambulatory Visit: Payer: Self-pay | Admitting: Obstetrics and Gynecology

## 2022-06-17 DIAGNOSIS — E041 Nontoxic single thyroid nodule: Secondary | ICD-10-CM

## 2022-06-18 ENCOUNTER — Ambulatory Visit
Admission: RE | Admit: 2022-06-18 | Discharge: 2022-06-18 | Disposition: A | Discharge status: Home / Self Care | Payer: Medicare PPO | Source: Ambulatory Visit | Attending: Obstetrics and Gynecology | Admitting: Obstetrics and Gynecology

## 2022-06-18 DIAGNOSIS — E041 Nontoxic single thyroid nodule: Secondary | ICD-10-CM

## 2022-06-18 DIAGNOSIS — E042 Nontoxic multinodular goiter: Secondary | ICD-10-CM | POA: Diagnosis not present

## 2022-06-25 ENCOUNTER — Encounter: Payer: Self-pay | Admitting: Internal Medicine

## 2022-07-01 DIAGNOSIS — H43823 Vitreomacular adhesion, bilateral: Secondary | ICD-10-CM | POA: Diagnosis not present

## 2022-07-01 DIAGNOSIS — H4089 Other specified glaucoma: Secondary | ICD-10-CM | POA: Diagnosis not present

## 2022-07-01 DIAGNOSIS — H04123 Dry eye syndrome of bilateral lacrimal glands: Secondary | ICD-10-CM | POA: Diagnosis not present

## 2022-07-01 DIAGNOSIS — H26493 Other secondary cataract, bilateral: Secondary | ICD-10-CM | POA: Diagnosis not present

## 2022-07-01 DIAGNOSIS — H33103 Unspecified retinoschisis, bilateral: Secondary | ICD-10-CM | POA: Diagnosis not present

## 2022-07-17 DIAGNOSIS — K635 Polyp of colon: Secondary | ICD-10-CM | POA: Diagnosis not present

## 2022-07-17 DIAGNOSIS — K648 Other hemorrhoids: Secondary | ICD-10-CM | POA: Diagnosis not present

## 2022-07-17 DIAGNOSIS — K5730 Diverticulosis of large intestine without perforation or abscess without bleeding: Secondary | ICD-10-CM | POA: Diagnosis not present

## 2022-07-17 DIAGNOSIS — K644 Residual hemorrhoidal skin tags: Secondary | ICD-10-CM | POA: Diagnosis not present

## 2022-07-17 DIAGNOSIS — Z1211 Encounter for screening for malignant neoplasm of colon: Secondary | ICD-10-CM | POA: Diagnosis not present

## 2022-07-19 DIAGNOSIS — K635 Polyp of colon: Secondary | ICD-10-CM | POA: Diagnosis not present

## 2022-07-23 DIAGNOSIS — H33103 Unspecified retinoschisis, bilateral: Secondary | ICD-10-CM | POA: Diagnosis not present

## 2022-07-23 DIAGNOSIS — H4089 Other specified glaucoma: Secondary | ICD-10-CM | POA: Diagnosis not present

## 2022-07-23 DIAGNOSIS — H43823 Vitreomacular adhesion, bilateral: Secondary | ICD-10-CM | POA: Diagnosis not present

## 2022-07-23 DIAGNOSIS — H26493 Other secondary cataract, bilateral: Secondary | ICD-10-CM | POA: Diagnosis not present

## 2022-07-23 DIAGNOSIS — H04123 Dry eye syndrome of bilateral lacrimal glands: Secondary | ICD-10-CM | POA: Diagnosis not present

## 2022-08-26 DIAGNOSIS — M810 Age-related osteoporosis without current pathological fracture: Secondary | ICD-10-CM | POA: Diagnosis not present

## 2022-09-03 DIAGNOSIS — H401134 Primary open-angle glaucoma, bilateral, indeterminate stage: Secondary | ICD-10-CM | POA: Diagnosis not present

## 2022-09-03 DIAGNOSIS — H43811 Vitreous degeneration, right eye: Secondary | ICD-10-CM | POA: Diagnosis not present

## 2022-09-03 DIAGNOSIS — H33193 Other retinoschisis and retinal cysts, bilateral: Secondary | ICD-10-CM | POA: Diagnosis not present

## 2022-09-03 DIAGNOSIS — H43823 Vitreomacular adhesion, bilateral: Secondary | ICD-10-CM | POA: Diagnosis not present

## 2022-09-11 DIAGNOSIS — M810 Age-related osteoporosis without current pathological fracture: Secondary | ICD-10-CM | POA: Diagnosis not present

## 2022-09-30 DIAGNOSIS — H35352 Cystoid macular degeneration, left eye: Secondary | ICD-10-CM | POA: Diagnosis not present

## 2022-09-30 DIAGNOSIS — H401134 Primary open-angle glaucoma, bilateral, indeterminate stage: Secondary | ICD-10-CM | POA: Diagnosis not present

## 2022-09-30 DIAGNOSIS — H35031 Hypertensive retinopathy, right eye: Secondary | ICD-10-CM | POA: Diagnosis not present

## 2022-09-30 DIAGNOSIS — H35361 Drusen (degenerative) of macula, right eye: Secondary | ICD-10-CM | POA: Diagnosis not present

## 2022-09-30 DIAGNOSIS — H33102 Unspecified retinoschisis, left eye: Secondary | ICD-10-CM | POA: Diagnosis not present

## 2022-10-09 DIAGNOSIS — I10 Essential (primary) hypertension: Secondary | ICD-10-CM | POA: Diagnosis not present

## 2022-10-09 DIAGNOSIS — E1139 Type 2 diabetes mellitus with other diabetic ophthalmic complication: Secondary | ICD-10-CM | POA: Diagnosis not present

## 2022-10-09 DIAGNOSIS — E119 Type 2 diabetes mellitus without complications: Secondary | ICD-10-CM | POA: Diagnosis not present

## 2022-10-09 DIAGNOSIS — G4730 Sleep apnea, unspecified: Secondary | ICD-10-CM | POA: Diagnosis not present

## 2022-10-09 DIAGNOSIS — H4051X3 Glaucoma secondary to other eye disorders, right eye, severe stage: Secondary | ICD-10-CM | POA: Diagnosis not present

## 2022-10-09 DIAGNOSIS — Z7984 Long term (current) use of oral hypoglycemic drugs: Secondary | ICD-10-CM | POA: Diagnosis not present

## 2022-10-09 DIAGNOSIS — Z79899 Other long term (current) drug therapy: Secondary | ICD-10-CM | POA: Diagnosis not present

## 2022-10-09 DIAGNOSIS — E039 Hypothyroidism, unspecified: Secondary | ICD-10-CM | POA: Diagnosis not present

## 2022-10-09 DIAGNOSIS — J45909 Unspecified asthma, uncomplicated: Secondary | ICD-10-CM | POA: Diagnosis not present

## 2022-10-09 DIAGNOSIS — K219 Gastro-esophageal reflux disease without esophagitis: Secondary | ICD-10-CM | POA: Diagnosis not present

## 2022-10-09 DIAGNOSIS — H42 Glaucoma in diseases classified elsewhere: Secondary | ICD-10-CM | POA: Diagnosis not present

## 2022-10-09 DIAGNOSIS — H4089 Other specified glaucoma: Secondary | ICD-10-CM | POA: Diagnosis not present

## 2022-10-28 DIAGNOSIS — H4089 Other specified glaucoma: Secondary | ICD-10-CM | POA: Diagnosis not present

## 2022-10-28 DIAGNOSIS — H33103 Unspecified retinoschisis, bilateral: Secondary | ICD-10-CM | POA: Diagnosis not present

## 2022-11-11 DIAGNOSIS — H401134 Primary open-angle glaucoma, bilateral, indeterminate stage: Secondary | ICD-10-CM | POA: Diagnosis not present

## 2022-12-05 DIAGNOSIS — H35351 Cystoid macular degeneration, right eye: Secondary | ICD-10-CM | POA: Diagnosis not present

## 2022-12-05 DIAGNOSIS — H4089 Other specified glaucoma: Secondary | ICD-10-CM | POA: Diagnosis not present

## 2023-01-01 ENCOUNTER — Other Ambulatory Visit: Payer: Self-pay | Admitting: Endocrinology

## 2023-01-01 DIAGNOSIS — E236 Other disorders of pituitary gland: Secondary | ICD-10-CM | POA: Diagnosis not present

## 2023-01-01 DIAGNOSIS — M810 Age-related osteoporosis without current pathological fracture: Secondary | ICD-10-CM | POA: Diagnosis not present

## 2023-01-01 DIAGNOSIS — E559 Vitamin D deficiency, unspecified: Secondary | ICD-10-CM | POA: Diagnosis not present

## 2023-01-01 DIAGNOSIS — E039 Hypothyroidism, unspecified: Secondary | ICD-10-CM | POA: Diagnosis not present

## 2023-01-01 DIAGNOSIS — E7800 Pure hypercholesterolemia, unspecified: Secondary | ICD-10-CM | POA: Diagnosis not present

## 2023-01-01 DIAGNOSIS — D352 Benign neoplasm of pituitary gland: Secondary | ICD-10-CM | POA: Diagnosis not present

## 2023-01-01 DIAGNOSIS — R7301 Impaired fasting glucose: Secondary | ICD-10-CM | POA: Diagnosis not present

## 2023-01-09 DIAGNOSIS — E7800 Pure hypercholesterolemia, unspecified: Secondary | ICD-10-CM | POA: Diagnosis not present

## 2023-01-09 DIAGNOSIS — E1165 Type 2 diabetes mellitus with hyperglycemia: Secondary | ICD-10-CM | POA: Diagnosis not present

## 2023-01-17 IMAGING — DX DG SHOULDER 2+V*R*
3 series · 3 of 3 positions shown · non-contrast
Comparison: Shoulder MRI 03/20/2014

CLINICAL DATA: Right shoulder pain, unspecified chronicity. Patient
reports chronic right shoulder pain with decreased range of motion.

EXAM:
RIGHT SHOULDER - 2+ VIEW

[dg shoulder right (1 of 3)]
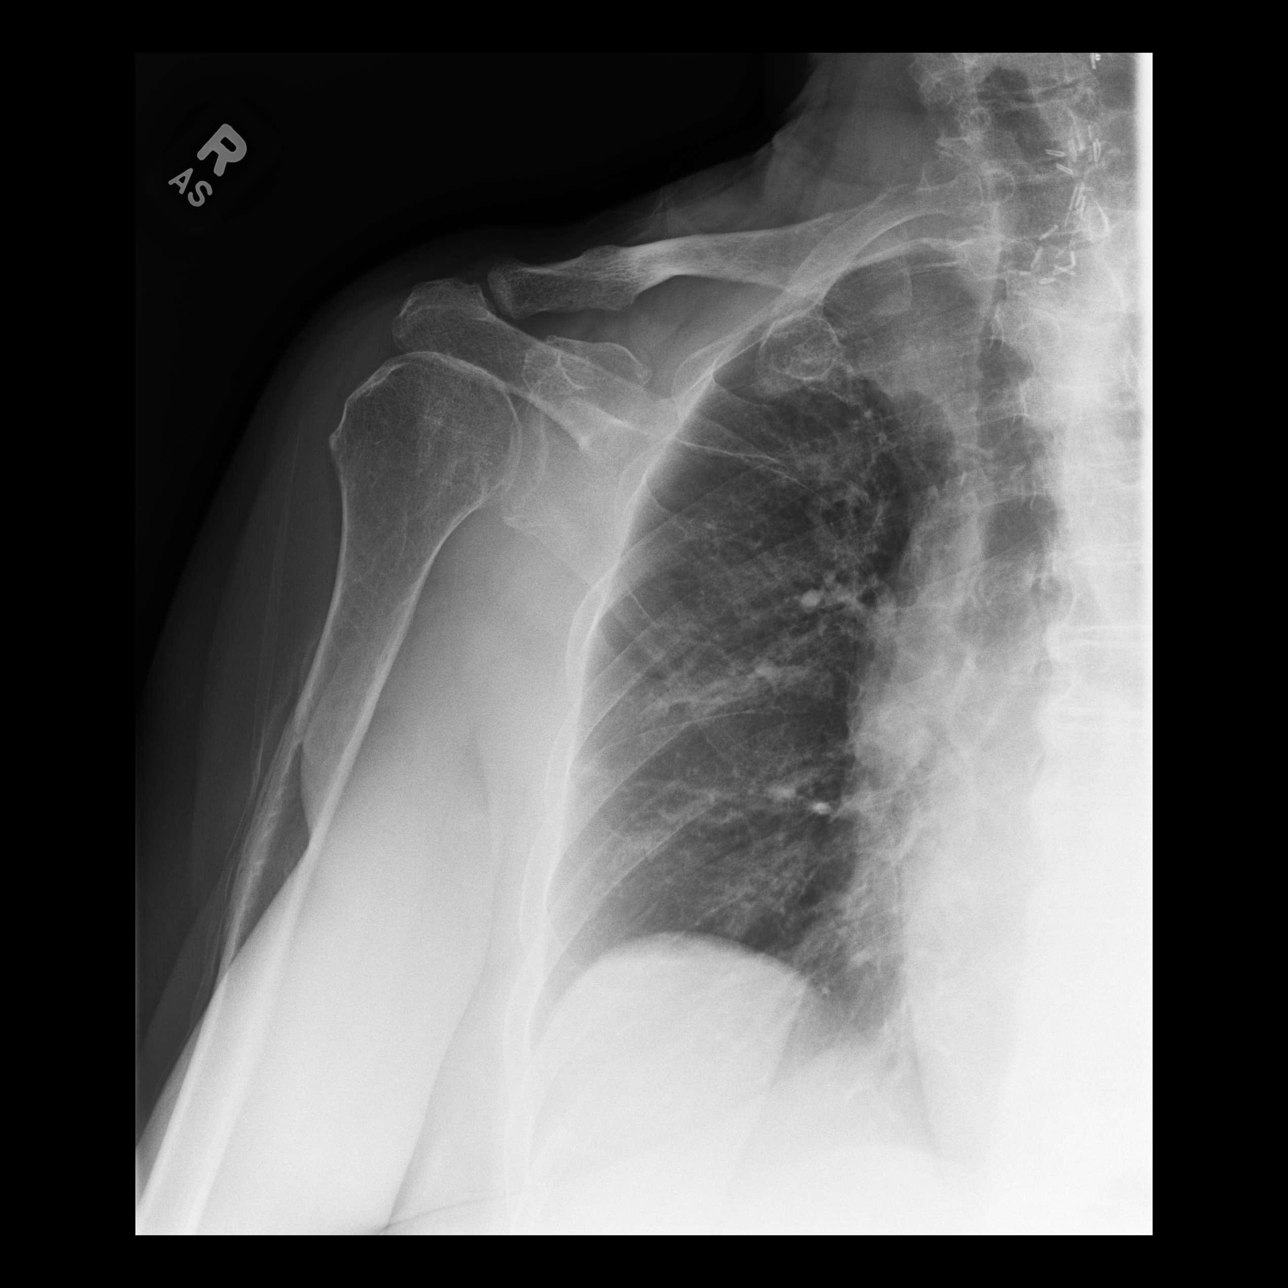

[dg shoulder right (2 of 3)]
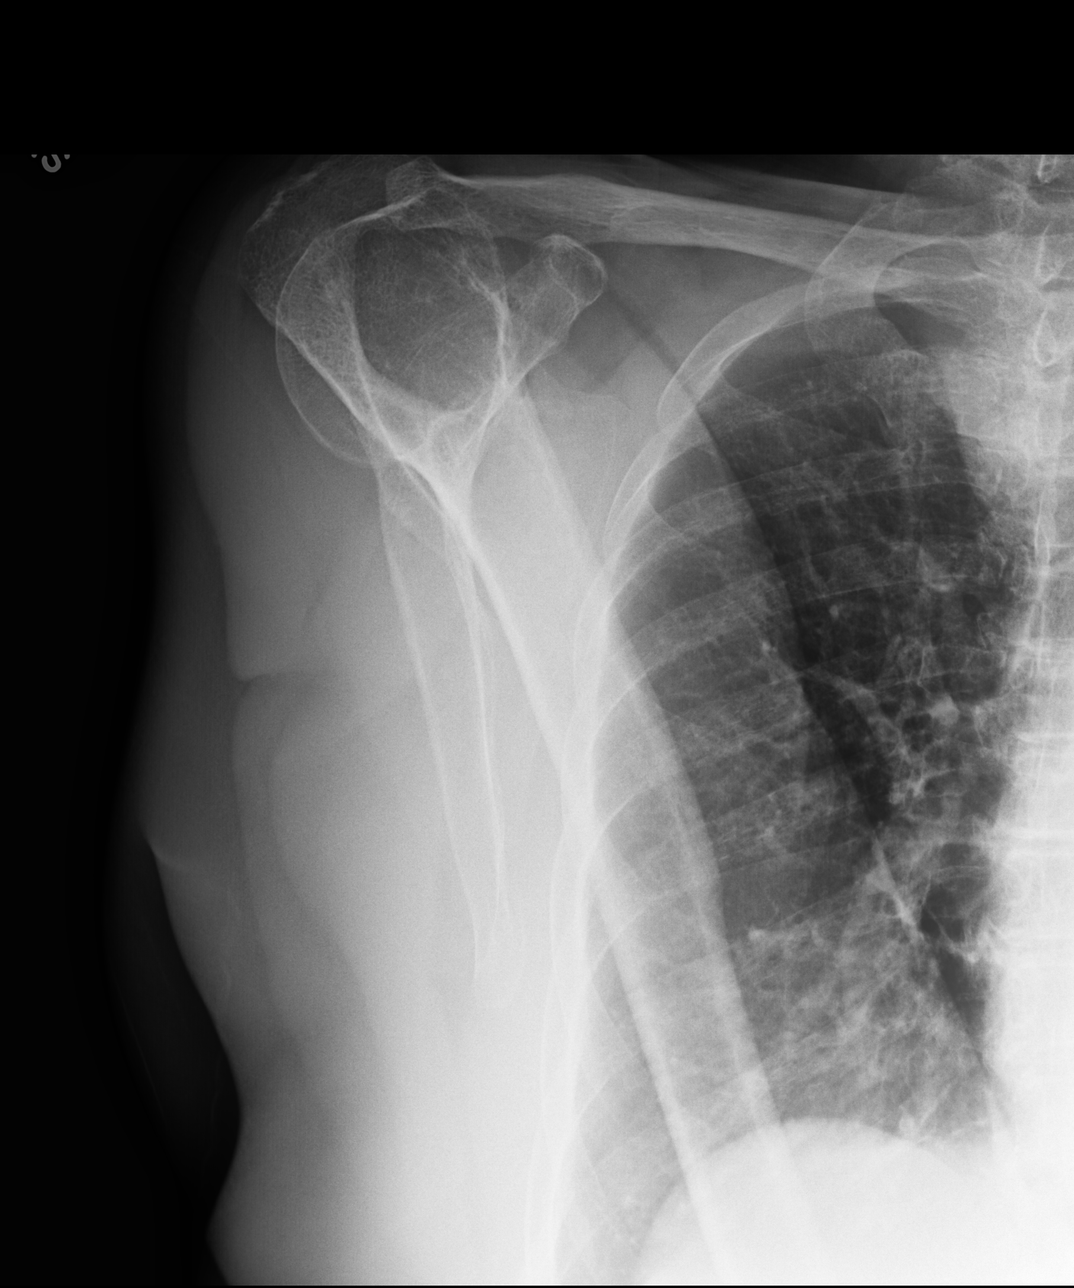

[dg shoulder right (3 of 3)]
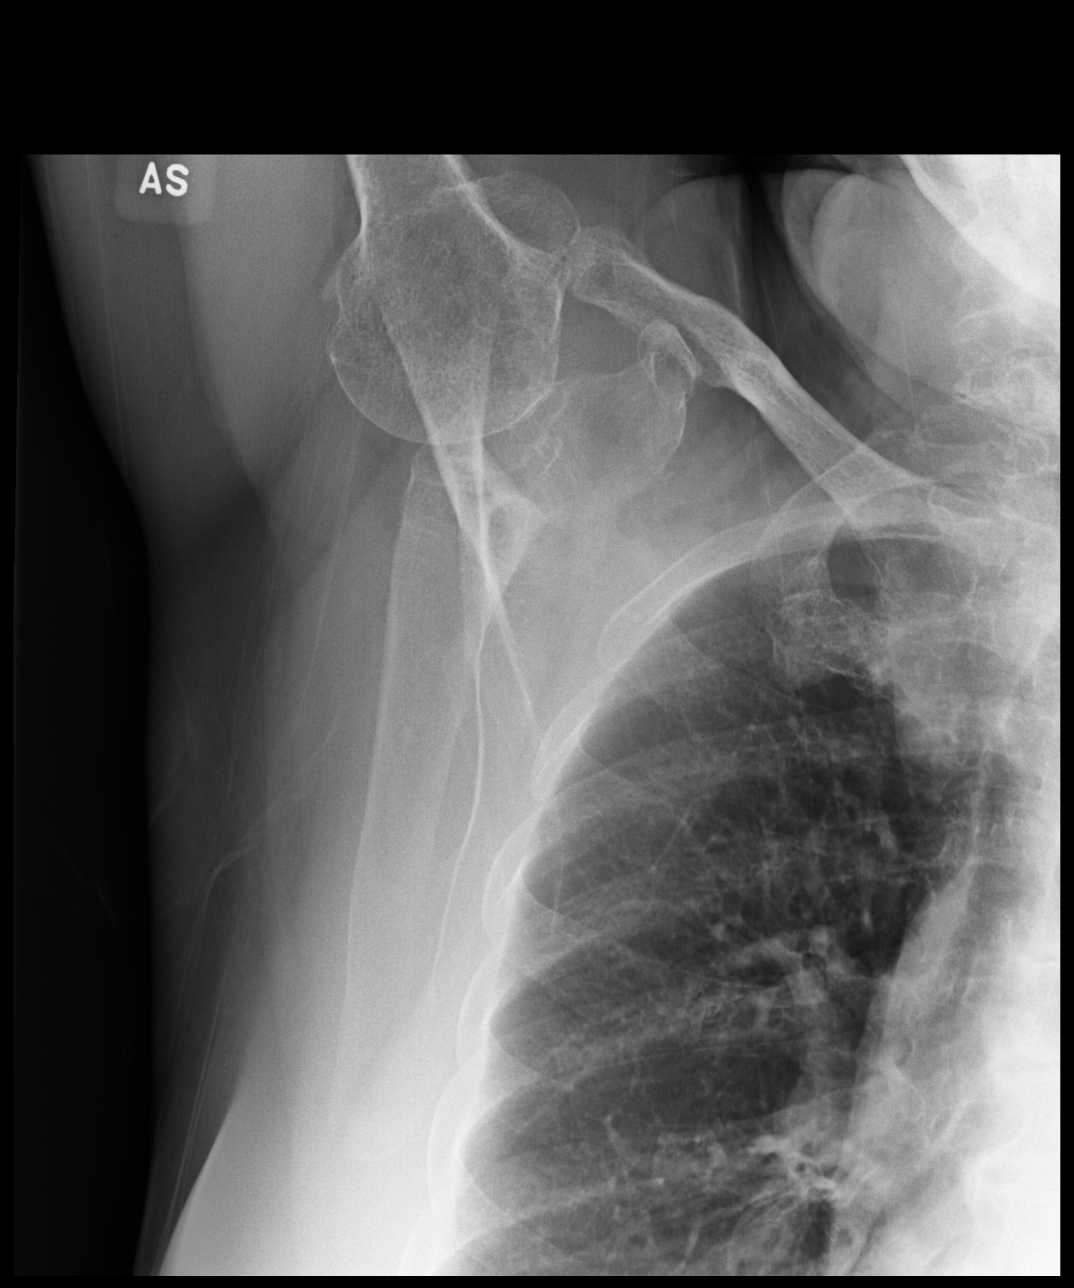

[3 of 3 positions shown; findings below may reference images not displayed]

FINDINGS: Mild acromioclavicular degenerative change with inferior spurring.
Glenohumeral joint space is preserved with minimal inferior glenoid
spurring. No fracture or erosion. No evidence of a vascular
necrosis. Possible soft tissue calcification is seen adjacent to the
proximal humerus, only on the axillary view.
IMPRESSION: 1. Mild acromioclavicular and minimal glenohumeral degenerative
change.
2. Possible soft tissue calcification adjacent to the proximal
humerus, possible calcific tendinopathy.

## 2023-01-21 DIAGNOSIS — H5010 Unspecified exotropia: Secondary | ICD-10-CM | POA: Diagnosis not present

## 2023-01-21 DIAGNOSIS — H4089 Other specified glaucoma: Secondary | ICD-10-CM | POA: Diagnosis not present

## 2023-01-21 DIAGNOSIS — H43823 Vitreomacular adhesion, bilateral: Secondary | ICD-10-CM | POA: Diagnosis not present

## 2023-01-21 DIAGNOSIS — H26493 Other secondary cataract, bilateral: Secondary | ICD-10-CM | POA: Diagnosis not present

## 2023-01-21 DIAGNOSIS — H33103 Unspecified retinoschisis, bilateral: Secondary | ICD-10-CM | POA: Diagnosis not present

## 2023-01-21 DIAGNOSIS — H35351 Cystoid macular degeneration, right eye: Secondary | ICD-10-CM | POA: Diagnosis not present

## 2023-01-21 DIAGNOSIS — H04123 Dry eye syndrome of bilateral lacrimal glands: Secondary | ICD-10-CM | POA: Diagnosis not present

## 2023-01-22 DIAGNOSIS — H6121 Impacted cerumen, right ear: Secondary | ICD-10-CM | POA: Diagnosis not present

## 2023-01-22 DIAGNOSIS — H9192 Unspecified hearing loss, left ear: Secondary | ICD-10-CM | POA: Diagnosis not present

## 2023-01-22 DIAGNOSIS — R002 Palpitations: Secondary | ICD-10-CM | POA: Diagnosis not present

## 2023-01-22 DIAGNOSIS — I10 Essential (primary) hypertension: Secondary | ICD-10-CM | POA: Diagnosis not present

## 2023-01-22 DIAGNOSIS — M65331 Trigger finger, right middle finger: Secondary | ICD-10-CM | POA: Diagnosis not present

## 2023-01-22 DIAGNOSIS — R5383 Other fatigue: Secondary | ICD-10-CM | POA: Diagnosis not present

## 2023-01-22 DIAGNOSIS — E113213 Type 2 diabetes mellitus with mild nonproliferative diabetic retinopathy with macular edema, bilateral: Secondary | ICD-10-CM | POA: Diagnosis not present

## 2023-01-22 DIAGNOSIS — E782 Mixed hyperlipidemia: Secondary | ICD-10-CM | POA: Diagnosis not present

## 2023-01-22 DIAGNOSIS — E039 Hypothyroidism, unspecified: Secondary | ICD-10-CM | POA: Diagnosis not present

## 2023-01-22 DIAGNOSIS — E538 Deficiency of other specified B group vitamins: Secondary | ICD-10-CM | POA: Diagnosis not present

## 2023-01-28 ENCOUNTER — Ambulatory Visit
Admission: RE | Admit: 2023-01-28 | Discharge: 2023-01-28 | Disposition: A | Discharge status: Home / Self Care | Payer: Medicare PPO | Source: Ambulatory Visit | Attending: Endocrinology | Admitting: Endocrinology

## 2023-01-28 DIAGNOSIS — E049 Nontoxic goiter, unspecified: Secondary | ICD-10-CM | POA: Diagnosis not present

## 2023-01-28 DIAGNOSIS — E039 Hypothyroidism, unspecified: Secondary | ICD-10-CM

## 2023-02-11 DIAGNOSIS — H5021 Vertical strabismus, right eye: Secondary | ICD-10-CM | POA: Diagnosis not present

## 2023-02-12 DIAGNOSIS — H903 Sensorineural hearing loss, bilateral: Secondary | ICD-10-CM | POA: Diagnosis not present

## 2023-02-12 DIAGNOSIS — H6121 Impacted cerumen, right ear: Secondary | ICD-10-CM | POA: Diagnosis not present

## 2023-02-12 DIAGNOSIS — H9202 Otalgia, left ear: Secondary | ICD-10-CM | POA: Diagnosis not present

## 2023-02-14 DIAGNOSIS — M65331 Trigger finger, right middle finger: Secondary | ICD-10-CM | POA: Diagnosis not present

## 2023-02-14 DIAGNOSIS — G5601 Carpal tunnel syndrome, right upper limb: Secondary | ICD-10-CM | POA: Diagnosis not present

## 2023-03-03 DIAGNOSIS — M810 Age-related osteoporosis without current pathological fracture: Secondary | ICD-10-CM | POA: Diagnosis not present

## 2023-03-04 DIAGNOSIS — H33193 Other retinoschisis and retinal cysts, bilateral: Secondary | ICD-10-CM | POA: Diagnosis not present

## 2023-03-04 DIAGNOSIS — H43811 Vitreous degeneration, right eye: Secondary | ICD-10-CM | POA: Diagnosis not present

## 2023-03-04 DIAGNOSIS — H43823 Vitreomacular adhesion, bilateral: Secondary | ICD-10-CM | POA: Diagnosis not present

## 2023-03-13 DIAGNOSIS — M810 Age-related osteoporosis without current pathological fracture: Secondary | ICD-10-CM | POA: Diagnosis not present

## 2023-03-28 DIAGNOSIS — H401134 Primary open-angle glaucoma, bilateral, indeterminate stage: Secondary | ICD-10-CM | POA: Diagnosis not present

## 2023-03-28 DIAGNOSIS — H532 Diplopia: Secondary | ICD-10-CM | POA: Diagnosis not present

## 2023-04-02 DIAGNOSIS — Z1231 Encounter for screening mammogram for malignant neoplasm of breast: Secondary | ICD-10-CM | POA: Diagnosis not present

## 2023-04-07 DIAGNOSIS — R922 Inconclusive mammogram: Secondary | ICD-10-CM | POA: Diagnosis not present

## 2023-04-23 DIAGNOSIS — E559 Vitamin D deficiency, unspecified: Secondary | ICD-10-CM | POA: Diagnosis not present

## 2023-04-23 DIAGNOSIS — E039 Hypothyroidism, unspecified: Secondary | ICD-10-CM | POA: Diagnosis not present

## 2023-04-23 DIAGNOSIS — E1165 Type 2 diabetes mellitus with hyperglycemia: Secondary | ICD-10-CM | POA: Diagnosis not present

## 2023-04-28 DIAGNOSIS — D352 Benign neoplasm of pituitary gland: Secondary | ICD-10-CM | POA: Diagnosis not present

## 2023-04-28 DIAGNOSIS — E039 Hypothyroidism, unspecified: Secondary | ICD-10-CM | POA: Diagnosis not present

## 2023-04-28 DIAGNOSIS — E1165 Type 2 diabetes mellitus with hyperglycemia: Secondary | ICD-10-CM | POA: Diagnosis not present

## 2023-04-28 DIAGNOSIS — R7301 Impaired fasting glucose: Secondary | ICD-10-CM | POA: Diagnosis not present

## 2023-04-28 DIAGNOSIS — I10 Essential (primary) hypertension: Secondary | ICD-10-CM | POA: Diagnosis not present

## 2023-04-28 DIAGNOSIS — M810 Age-related osteoporosis without current pathological fracture: Secondary | ICD-10-CM | POA: Diagnosis not present

## 2023-04-28 DIAGNOSIS — E236 Other disorders of pituitary gland: Secondary | ICD-10-CM | POA: Diagnosis not present

## 2023-05-12 DIAGNOSIS — J4530 Mild persistent asthma, uncomplicated: Secondary | ICD-10-CM | POA: Diagnosis not present

## 2023-05-12 DIAGNOSIS — J3081 Allergic rhinitis due to animal (cat) (dog) hair and dander: Secondary | ICD-10-CM | POA: Diagnosis not present

## 2023-05-12 DIAGNOSIS — H1045 Other chronic allergic conjunctivitis: Secondary | ICD-10-CM | POA: Diagnosis not present

## 2023-05-22 DIAGNOSIS — H33103 Unspecified retinoschisis, bilateral: Secondary | ICD-10-CM | POA: Diagnosis not present

## 2023-05-22 DIAGNOSIS — H04123 Dry eye syndrome of bilateral lacrimal glands: Secondary | ICD-10-CM | POA: Diagnosis not present

## 2023-05-22 DIAGNOSIS — H43823 Vitreomacular adhesion, bilateral: Secondary | ICD-10-CM | POA: Diagnosis not present

## 2023-05-22 DIAGNOSIS — H5010 Unspecified exotropia: Secondary | ICD-10-CM | POA: Diagnosis not present

## 2023-05-22 DIAGNOSIS — H26493 Other secondary cataract, bilateral: Secondary | ICD-10-CM | POA: Diagnosis not present

## 2023-05-22 DIAGNOSIS — H4089 Other specified glaucoma: Secondary | ICD-10-CM | POA: Diagnosis not present

## 2023-05-22 DIAGNOSIS — H35351 Cystoid macular degeneration, right eye: Secondary | ICD-10-CM | POA: Diagnosis not present

## 2023-06-16 DIAGNOSIS — R5383 Other fatigue: Secondary | ICD-10-CM | POA: Diagnosis not present

## 2023-06-16 DIAGNOSIS — Z124 Encounter for screening for malignant neoplasm of cervix: Secondary | ICD-10-CM | POA: Diagnosis not present

## 2023-06-16 DIAGNOSIS — Z6827 Body mass index (BMI) 27.0-27.9, adult: Secondary | ICD-10-CM | POA: Diagnosis not present

## 2023-07-08 DIAGNOSIS — Z0000 Encounter for general adult medical examination without abnormal findings: Secondary | ICD-10-CM | POA: Diagnosis not present

## 2023-07-08 DIAGNOSIS — I519 Heart disease, unspecified: Secondary | ICD-10-CM | POA: Diagnosis not present

## 2023-07-08 DIAGNOSIS — H401130 Primary open-angle glaucoma, bilateral, stage unspecified: Secondary | ICD-10-CM | POA: Diagnosis not present

## 2023-07-08 DIAGNOSIS — E113213 Type 2 diabetes mellitus with mild nonproliferative diabetic retinopathy with macular edema, bilateral: Secondary | ICD-10-CM | POA: Diagnosis not present

## 2023-07-08 DIAGNOSIS — E782 Mixed hyperlipidemia: Secondary | ICD-10-CM | POA: Diagnosis not present

## 2023-07-08 DIAGNOSIS — Z1331 Encounter for screening for depression: Secondary | ICD-10-CM | POA: Diagnosis not present

## 2023-07-08 DIAGNOSIS — I10 Essential (primary) hypertension: Secondary | ICD-10-CM | POA: Diagnosis not present

## 2023-07-08 DIAGNOSIS — M810 Age-related osteoporosis without current pathological fracture: Secondary | ICD-10-CM | POA: Diagnosis not present

## 2023-07-08 DIAGNOSIS — E039 Hypothyroidism, unspecified: Secondary | ICD-10-CM | POA: Diagnosis not present

## 2023-07-21 DIAGNOSIS — H5010 Unspecified exotropia: Secondary | ICD-10-CM | POA: Diagnosis not present

## 2023-07-21 DIAGNOSIS — H33103 Unspecified retinoschisis, bilateral: Secondary | ICD-10-CM | POA: Diagnosis not present

## 2023-07-21 DIAGNOSIS — H26493 Other secondary cataract, bilateral: Secondary | ICD-10-CM | POA: Diagnosis not present

## 2023-07-21 DIAGNOSIS — H35351 Cystoid macular degeneration, right eye: Secondary | ICD-10-CM | POA: Diagnosis not present

## 2023-07-21 DIAGNOSIS — H43823 Vitreomacular adhesion, bilateral: Secondary | ICD-10-CM | POA: Diagnosis not present

## 2023-07-21 DIAGNOSIS — H04123 Dry eye syndrome of bilateral lacrimal glands: Secondary | ICD-10-CM | POA: Diagnosis not present

## 2023-07-21 DIAGNOSIS — H4089 Other specified glaucoma: Secondary | ICD-10-CM | POA: Diagnosis not present

## 2023-08-26 DIAGNOSIS — M810 Age-related osteoporosis without current pathological fracture: Secondary | ICD-10-CM | POA: Diagnosis not present

## 2023-09-11 DIAGNOSIS — M810 Age-related osteoporosis without current pathological fracture: Secondary | ICD-10-CM | POA: Diagnosis not present

## 2023-09-12 DIAGNOSIS — H401134 Primary open-angle glaucoma, bilateral, indeterminate stage: Secondary | ICD-10-CM | POA: Diagnosis not present

## 2023-09-12 DIAGNOSIS — H43811 Vitreous degeneration, right eye: Secondary | ICD-10-CM | POA: Diagnosis not present

## 2023-09-12 DIAGNOSIS — H43823 Vitreomacular adhesion, bilateral: Secondary | ICD-10-CM | POA: Diagnosis not present

## 2023-09-12 DIAGNOSIS — H33193 Other retinoschisis and retinal cysts, bilateral: Secondary | ICD-10-CM | POA: Diagnosis not present

## 2023-09-15 ENCOUNTER — Ambulatory Visit (HOSPITAL_BASED_OUTPATIENT_CLINIC_OR_DEPARTMENT_OTHER): Payer: Medicare PPO | Admitting: Cardiology

## 2023-10-01 DIAGNOSIS — H33102 Unspecified retinoschisis, left eye: Secondary | ICD-10-CM | POA: Diagnosis not present

## 2023-10-01 DIAGNOSIS — H401113 Primary open-angle glaucoma, right eye, severe stage: Secondary | ICD-10-CM | POA: Diagnosis not present

## 2023-10-01 DIAGNOSIS — H401121 Primary open-angle glaucoma, left eye, mild stage: Secondary | ICD-10-CM | POA: Diagnosis not present

## 2023-10-01 DIAGNOSIS — H35352 Cystoid macular degeneration, left eye: Secondary | ICD-10-CM | POA: Diagnosis not present

## 2023-10-01 DIAGNOSIS — H35361 Drusen (degenerative) of macula, right eye: Secondary | ICD-10-CM | POA: Diagnosis not present

## 2023-10-01 DIAGNOSIS — H532 Diplopia: Secondary | ICD-10-CM | POA: Diagnosis not present

## 2023-10-06 DIAGNOSIS — H26493 Other secondary cataract, bilateral: Secondary | ICD-10-CM | POA: Diagnosis not present

## 2023-10-06 DIAGNOSIS — H04123 Dry eye syndrome of bilateral lacrimal glands: Secondary | ICD-10-CM | POA: Diagnosis not present

## 2023-10-06 DIAGNOSIS — H4089 Other specified glaucoma: Secondary | ICD-10-CM | POA: Diagnosis not present

## 2023-10-06 DIAGNOSIS — H43823 Vitreomacular adhesion, bilateral: Secondary | ICD-10-CM | POA: Diagnosis not present

## 2023-10-06 DIAGNOSIS — H33103 Unspecified retinoschisis, bilateral: Secondary | ICD-10-CM | POA: Diagnosis not present

## 2023-10-06 DIAGNOSIS — H5010 Unspecified exotropia: Secondary | ICD-10-CM | POA: Diagnosis not present

## 2023-10-06 DIAGNOSIS — H35351 Cystoid macular degeneration, right eye: Secondary | ICD-10-CM | POA: Diagnosis not present

## 2023-11-12 DIAGNOSIS — J3081 Allergic rhinitis due to animal (cat) (dog) hair and dander: Secondary | ICD-10-CM | POA: Diagnosis not present

## 2023-11-12 DIAGNOSIS — H1045 Other chronic allergic conjunctivitis: Secondary | ICD-10-CM | POA: Diagnosis not present

## 2023-11-12 DIAGNOSIS — J4530 Mild persistent asthma, uncomplicated: Secondary | ICD-10-CM | POA: Diagnosis not present

## 2023-11-29 LAB — LIPID PANEL
Cholesterol: 200
HDL: 58
LDL: 101
Triglycerides: 206 — AB (ref 40–160)

## 2023-11-29 LAB — AMB RESULTS CONSOLE CBG: Glucose: 104

## 2023-11-29 NOTE — Progress Notes (Unsigned)
 Pt. Attended the Community event on 11/29/2023. Pt Blood Glucose 104 non-fasting, Pt does not have SDOH insecurities. Pt recommended to lower cholesterol and pt instructed to check with PCP about cholesterol. Pt has PCP, pt did not document insurance coverage.

## 2023-12-02 DIAGNOSIS — H04123 Dry eye syndrome of bilateral lacrimal glands: Secondary | ICD-10-CM | POA: Diagnosis not present

## 2023-12-02 DIAGNOSIS — H43823 Vitreomacular adhesion, bilateral: Secondary | ICD-10-CM | POA: Diagnosis not present

## 2023-12-02 DIAGNOSIS — H5010 Unspecified exotropia: Secondary | ICD-10-CM | POA: Diagnosis not present

## 2023-12-02 DIAGNOSIS — H4089 Other specified glaucoma: Secondary | ICD-10-CM | POA: Diagnosis not present

## 2023-12-02 DIAGNOSIS — H35351 Cystoid macular degeneration, right eye: Secondary | ICD-10-CM | POA: Diagnosis not present

## 2023-12-02 DIAGNOSIS — H26493 Other secondary cataract, bilateral: Secondary | ICD-10-CM | POA: Diagnosis not present

## 2023-12-02 DIAGNOSIS — H33103 Unspecified retinoschisis, bilateral: Secondary | ICD-10-CM | POA: Diagnosis not present

## 2023-12-16 ENCOUNTER — Encounter (HOSPITAL_BASED_OUTPATIENT_CLINIC_OR_DEPARTMENT_OTHER): Payer: Self-pay | Admitting: Cardiology

## 2023-12-16 ENCOUNTER — Ambulatory Visit (HOSPITAL_BASED_OUTPATIENT_CLINIC_OR_DEPARTMENT_OTHER): Payer: Medicare PPO | Admitting: Cardiology

## 2023-12-16 VITALS — BP 136/70 | HR 103 | Ht 62.0 in | Wt 159.0 lb

## 2023-12-16 DIAGNOSIS — E782 Mixed hyperlipidemia: Secondary | ICD-10-CM | POA: Diagnosis not present

## 2023-12-16 DIAGNOSIS — I10 Essential (primary) hypertension: Secondary | ICD-10-CM | POA: Diagnosis not present

## 2023-12-16 DIAGNOSIS — I5189 Other ill-defined heart diseases: Secondary | ICD-10-CM | POA: Diagnosis not present

## 2023-12-16 DIAGNOSIS — R5382 Chronic fatigue, unspecified: Secondary | ICD-10-CM | POA: Diagnosis not present

## 2023-12-16 DIAGNOSIS — G4733 Obstructive sleep apnea (adult) (pediatric): Secondary | ICD-10-CM | POA: Diagnosis not present

## 2023-12-16 NOTE — Progress Notes (Signed)
 Cardiology Office Note:  .   Date:  12/16/2023  ID:  Tammy Robertson, DOB 04/08/1946, MRN [id number] PCP: Faustina Hood, MD  [facility] Providers Cardiologist:  Sheryle Donning, MD {  History of Present Illness: .   Tammy Robertson is a 78 y.o. female with PMH hyperlipidemia, hypertension, hypothyroidism, type II diabetes seen for history of diastolic dysfunction today.   Today: Referred for diastolic dysfunction at the request of Dr. Felipe Horton. Notes reviewed today; per notes echo in 2014 showed grade 1 diastolic dysfunction with elevated filling pressures (Dr. Berry Bristol). I do not have a copy of the reports or any images.   Has noticed fatigue for about the last year. Has been told that her workups are ok. Has been dealing with ups and downs with her depression. Had terrible side effects on sertraline. Has OSA, was on CPAP for about 9 mos and then off for about the last two years as the machine wasn't working. Feels tired even when she first gets up in the morning.  Walks up to about 30 minutes at a time about 3 times/week (slow pace/at the store mostly), also tries to walk around the house/between commercials. No stairs in her house. Feels some shortness of breath after exertion.   Doesn't check BP at home. Initially elevated today. Does have a cuff, reviewed how to use.   ROS also positive for vertigo over the last month.   ROS: Denies chest pain, shortness of breath at rest or with normal exertion. No PND, orthopnea, LE edema or unexpected weight gain. No syncope or palpitations. ROS otherwise negative except as noted.   Studies Reviewed: Aaron Aas    EKG:  EKG Interpretation Date/Time:  Tuesday December 16 2023 10:33:38 EDT Ventricular Rate:  103 PR Interval:  156 QRS Duration:  70 QT Interval:  342 QTC Calculation: 448 R Axis:   -50  Text Interpretation: Sinus tachycardia Left axis deviation Poor R wave progression Confirmed by Sheryle Donning [phone]) on 12/16/2023 7:42:55  PM    Physical Exam:   VS:  BP 136/70   Pulse (!) 103   Ht 5\' 2"  (1.575 m)   Wt 159 lb (72.1 kg)   SpO2 97%   BMI 29.08 kg/m    Wt Readings from Last 3 Encounters:  12/16/23 159 lb (72.1 kg)  04/10/20 150 lb 12.8 oz (68.4 kg)  02/10/15 157 lb 9.6 oz (71.5 kg)    GEN: Well nourished, well developed in no acute distress HEENT: Normal, moist mucous membranes NECK: No JVD CARDIAC: regular rhythm, normal S1 and S2, no rubs or gallops. No murmur. VASCULAR: Radial and DP pulses 2+ bilaterally. No carotid bruits RESPIRATORY:  Clear to auscultation without rales, wheezing or rhonchi  ABDOMEN: Soft, non-tender, non-distended MUSCULOSKELETAL:  Ambulates independently SKIN: Warm and dry, no edema NEUROLOGIC:  Alert and oriented x 3. No focal neuro deficits noted. PSYCHIATRIC:  Normal affect    ASSESSMENT AND PLAN: .    Fatigue OSA, not currently on treatment History of diastolic dysfunction, without clinical heart failure -we discussed diastolic dysfunction at length, reviewed what it means, spectrum (ie that grade 1 is typically mild/asymptomatic) -will update echo -if echo unremarkable, suspect untreated OSA may be partial etiology of her symptoms, recommended she reach out to pulmonary to discuss re-evaluation  Hyperlipidemia -she prefers to manage with red yeast rice  Hypertension -continue losartan  CV risk counseling and prevention -recommend heart healthy/Mediterranean diet, with whole grains, fruits, vegetable, fish, lean meats, nuts, and  olive oil. Limit salt. -recommend moderate walking, 3-5 times/week for 30-50 minutes each session. Aim for at least 150 minutes.week. Goal should be pace of 3 miles/hours, or walking 1.5 miles in 30 minutes -recommend avoidance of tobacco products. Avoid excess alcohol.  Dispo: To be determined based on echo results. If no high risk features, discussed following up as needed  Signed, Sheryle Donning, MD   Sheryle Donning,  MD, PhD, [facility]  [facility]    Heart & Vascular at [facility] at [facility] [address] [place], [place] [address] [phone]

## 2023-12-16 NOTE — Patient Instructions (Addendum)
 Medication Instructions:  Your physician recommends that you continue on your current medications as directed. Please refer to the Current Medication list given to you today.  Testing/Procedures: Your physician has requested that you have an echocardiogram. Echocardiography is a painless test that uses sound waves to create images of your heart. It provides your doctor with information about the size and shape of your heart and how well your heart's chambers and valves are working. This procedure takes approximately one hour. There are no restrictions for this procedure. Please do NOT wear cologne, perfume, aftershave, or lotions (deodorant is allowed). Please arrive 15 minutes prior to your appointment time.  Please note: We ask at that you not bring children with you during ultrasound (echo/ vascular) testing. Due to room size and safety concerns, children are not allowed in the ultrasound rooms during exams. Our front office staff cannot provide observation of children in our lobby area while testing is being conducted. An adult accompanying a patient to their appointment will only be allowed in the ultrasound room at the discretion of the ultrasound technician under special circumstances. We apologize for any inconvenience.   Follow-Up: TBD based on ECHO RESULTS

## 2023-12-25 DIAGNOSIS — G5601 Carpal tunnel syndrome, right upper limb: Secondary | ICD-10-CM | POA: Diagnosis not present

## 2023-12-25 DIAGNOSIS — E039 Hypothyroidism, unspecified: Secondary | ICD-10-CM | POA: Diagnosis not present

## 2023-12-25 DIAGNOSIS — E782 Mixed hyperlipidemia: Secondary | ICD-10-CM | POA: Diagnosis not present

## 2023-12-25 DIAGNOSIS — I10 Essential (primary) hypertension: Secondary | ICD-10-CM | POA: Diagnosis not present

## 2023-12-25 DIAGNOSIS — E1169 Type 2 diabetes mellitus with other specified complication: Secondary | ICD-10-CM | POA: Diagnosis not present

## 2023-12-30 DIAGNOSIS — E7800 Pure hypercholesterolemia, unspecified: Secondary | ICD-10-CM | POA: Diagnosis not present

## 2024-01-06 DIAGNOSIS — E039 Hypothyroidism, unspecified: Secondary | ICD-10-CM | POA: Diagnosis not present

## 2024-01-06 DIAGNOSIS — E236 Other disorders of pituitary gland: Secondary | ICD-10-CM | POA: Diagnosis not present

## 2024-01-06 DIAGNOSIS — E7800 Pure hypercholesterolemia, unspecified: Secondary | ICD-10-CM | POA: Diagnosis not present

## 2024-01-06 DIAGNOSIS — E1165 Type 2 diabetes mellitus with hyperglycemia: Secondary | ICD-10-CM | POA: Diagnosis not present

## 2024-01-06 DIAGNOSIS — I10 Essential (primary) hypertension: Secondary | ICD-10-CM | POA: Diagnosis not present

## 2024-01-06 DIAGNOSIS — M810 Age-related osteoporosis without current pathological fracture: Secondary | ICD-10-CM | POA: Diagnosis not present

## 2024-01-06 DIAGNOSIS — E559 Vitamin D deficiency, unspecified: Secondary | ICD-10-CM | POA: Diagnosis not present

## 2024-01-13 ENCOUNTER — Other Ambulatory Visit (HOSPITAL_BASED_OUTPATIENT_CLINIC_OR_DEPARTMENT_OTHER): Payer: Self-pay

## 2024-01-13 ENCOUNTER — Other Ambulatory Visit (HOSPITAL_BASED_OUTPATIENT_CLINIC_OR_DEPARTMENT_OTHER)

## 2024-01-13 DIAGNOSIS — I10 Essential (primary) hypertension: Secondary | ICD-10-CM | POA: Diagnosis not present

## 2024-01-13 DIAGNOSIS — E782 Mixed hyperlipidemia: Secondary | ICD-10-CM | POA: Diagnosis not present

## 2024-01-13 DIAGNOSIS — R5382 Chronic fatigue, unspecified: Secondary | ICD-10-CM | POA: Diagnosis not present

## 2024-01-13 DIAGNOSIS — G4733 Obstructive sleep apnea (adult) (pediatric): Secondary | ICD-10-CM | POA: Diagnosis not present

## 2024-01-13 DIAGNOSIS — I5189 Other ill-defined heart diseases: Secondary | ICD-10-CM

## 2024-01-13 LAB — ECHOCARDIOGRAM COMPLETE
Area-P 1/2: 3.48 cm2
Est EF: >75
S' Lateral: 2.26 cm

## 2024-01-13 MED ORDER — SIMBRINZA 1-0.2 % OP SUSP
1.0000 [drp] | Freq: Three times a day (TID) | OPHTHALMIC | 6 refills | Status: AC
  Filled 2024-01-13: qty 8, 26d supply, fill #0
  Filled 2024-02-09: qty 8, 26d supply, fill #1
  Filled 2024-03-02: qty 8, 26d supply, fill #2
  Filled 2024-03-29: qty 8, 26d supply, fill #3
  Filled 2024-04-23: qty 8, 26d supply, fill #4
  Filled 2024-05-17: qty 8, 26d supply, fill #5
  Filled 2024-06-21: qty 8, 26d supply, fill #6

## 2024-01-15 ENCOUNTER — Other Ambulatory Visit (HOSPITAL_BASED_OUTPATIENT_CLINIC_OR_DEPARTMENT_OTHER): Payer: Self-pay

## 2024-01-22 ENCOUNTER — Other Ambulatory Visit (HOSPITAL_BASED_OUTPATIENT_CLINIC_OR_DEPARTMENT_OTHER): Payer: Self-pay

## 2024-01-24 ENCOUNTER — Other Ambulatory Visit (HOSPITAL_BASED_OUTPATIENT_CLINIC_OR_DEPARTMENT_OTHER): Payer: Self-pay

## 2024-01-24 MED ORDER — RHOPRESSA 0.02 % OP SOLN
1.0000 [drp] | Freq: Every evening | OPHTHALMIC | 1 refills | Status: DC
  Filled 2024-01-24: qty 7.5, 90d supply, fill #0
  Filled 2024-04-21: qty 7.5, 90d supply, fill #1

## 2024-01-26 ENCOUNTER — Other Ambulatory Visit (HOSPITAL_BASED_OUTPATIENT_CLINIC_OR_DEPARTMENT_OTHER): Payer: Self-pay

## 2024-01-28 ENCOUNTER — Other Ambulatory Visit (HOSPITAL_BASED_OUTPATIENT_CLINIC_OR_DEPARTMENT_OTHER): Payer: Self-pay

## 2024-01-28 MED ORDER — LUMIGAN 0.01 % OP SOLN
1.0000 [drp] | Freq: Every evening | OPHTHALMIC | 5 refills | Status: AC
Start: 2024-01-28 — End: ?
  Filled 2024-01-28: qty 7.5, 75d supply, fill #0
  Filled 2024-01-30: qty 7.5, 90d supply, fill #0
  Filled 2024-01-31: qty 7.5, 150d supply, fill #0
  Filled 2024-02-09: qty 7.5, 75d supply, fill #0
  Filled 2024-04-21: qty 7.5, 75d supply, fill #1
  Filled 2024-07-12 – 2024-08-02 (×2): qty 7.5, 75d supply, fill #2

## 2024-01-30 ENCOUNTER — Other Ambulatory Visit (HOSPITAL_BASED_OUTPATIENT_CLINIC_OR_DEPARTMENT_OTHER): Payer: Self-pay

## 2024-01-31 ENCOUNTER — Other Ambulatory Visit (HOSPITAL_BASED_OUTPATIENT_CLINIC_OR_DEPARTMENT_OTHER): Payer: Self-pay

## 2024-02-01 ENCOUNTER — Ambulatory Visit (HOSPITAL_BASED_OUTPATIENT_CLINIC_OR_DEPARTMENT_OTHER): Payer: Self-pay | Admitting: Cardiology

## 2024-02-02 ENCOUNTER — Other Ambulatory Visit (HOSPITAL_BASED_OUTPATIENT_CLINIC_OR_DEPARTMENT_OTHER): Payer: Self-pay

## 2024-02-02 DIAGNOSIS — H401121 Primary open-angle glaucoma, left eye, mild stage: Secondary | ICD-10-CM | POA: Diagnosis not present

## 2024-02-02 DIAGNOSIS — H401113 Primary open-angle glaucoma, right eye, severe stage: Secondary | ICD-10-CM | POA: Diagnosis not present

## 2024-02-09 ENCOUNTER — Other Ambulatory Visit (HOSPITAL_BASED_OUTPATIENT_CLINIC_OR_DEPARTMENT_OTHER): Payer: Self-pay

## 2024-02-10 DIAGNOSIS — M810 Age-related osteoporosis without current pathological fracture: Secondary | ICD-10-CM | POA: Diagnosis not present

## 2024-02-11 ENCOUNTER — Encounter (INDEPENDENT_AMBULATORY_CARE_PROVIDER_SITE_OTHER): Payer: Self-pay | Admitting: Otolaryngology

## 2024-02-11 ENCOUNTER — Ambulatory Visit (INDEPENDENT_AMBULATORY_CARE_PROVIDER_SITE_OTHER): Payer: Medicare PPO | Admitting: Otolaryngology

## 2024-02-11 VITALS — BP 147/79 | HR 90 | Ht 63.0 in | Wt 150.0 lb

## 2024-02-11 DIAGNOSIS — H903 Sensorineural hearing loss, bilateral: Secondary | ICD-10-CM | POA: Insufficient documentation

## 2024-02-11 DIAGNOSIS — R42 Dizziness and giddiness: Secondary | ICD-10-CM | POA: Insufficient documentation

## 2024-02-11 DIAGNOSIS — H8111 Benign paroxysmal vertigo, right ear: Secondary | ICD-10-CM | POA: Insufficient documentation

## 2024-02-11 DIAGNOSIS — H6121 Impacted cerumen, right ear: Secondary | ICD-10-CM | POA: Diagnosis not present

## 2024-02-11 DIAGNOSIS — H9201 Otalgia, right ear: Secondary | ICD-10-CM | POA: Insufficient documentation

## 2024-02-11 NOTE — Progress Notes (Signed)
 Patient ID: Tammy Robertson, female   DOB: 01/08/1946, 78 y.o.   MRN: [id number]  Follow up: Left referred otalgia, hearing loss New complaint: Recurrent dizziness  HPI: The patient is a 78 year old female who returns today for follow-up evaluation.  She was last seen in June 2024 for her referred left otalgia and bilateral mild sensorineural hearing loss. - Presents for follow-up regarding ear pain. Reports persistent pain in the left ear for approximately one year, which is also now occurring intermittently in the right ear. The pain is aggravated by touching the bony part of the ear. - Reports a subjective decline in hearing, necessitating an increase in television volume to levels between 85 and 90. - Reports a new onset of vertigo over the past two months. Describes a spinning sensation, like being on a roller coaster, lasting for approximately 30 seconds per episode. The first episode occurred upon standing up after rolling over in bed. Currently, the vertigo is triggered by tilting the head back, such as when instilling eye drops, and is becoming more frequent. This has caused some apprehension with head movements. - Has attempted self-management for the vertigo with head-rolling exercises, which have not been effective. - Reports a previous episode of similar vertigo approximately four to five years ago. Was evaluated by an ENT specialist and an audiologist at that time, who provided a sheet of exercises to perform at home. - Denies any history of wearing hearing aids. - No significant trauma reported.  Exam: General: Communicates without difficulty, well nourished, no acute distress. Head: Normocephalic, no evidence injury, no tenderness, facial buttresses intact without stepoff. Face/sinus: No tenderness to palpation and percussion. Facial movement is normal and symmetric. Eyes: PERRL, EOMI. No scleral icterus, conjunctivae clear. Neuro: CN II exam reveals vision grossly intact.  No nystagmus  at any point of gaze. Ears: Auricles well formed without lesions..  The left ear canal and tympanic membrane are normal.  Nose: External evaluation reveals normal support and skin without lesions.  Dorsum is intact.  Anterior rhinoscopy reveals congested mucosa over anterior aspect of inferior turbinates and intact septum.  No purulence noted. Oral:  Oral cavity and oropharynx are intact, symmetric, without erythema or edema.  Mucosa is moist without lesions. Neck: Full range of motion without pain.  There is no significant lymphadenopathy.  No masses palpable.  Thyroid  bed within normal limits to palpation.  Parotid glands and submandibular glands equal bilaterally without mass.  Trachea is midline. Neuro:  CN 2-12 grossly intact. Vestibular: No nystagmus at any point of gaze.  Vestibular: Dix-Hallpike produces rotational nystagmus with right-sided positioning. Vestibular: There is no nystagmus with pneumatic pressure on either tympanic membrane or Valsalva. The cerebellar examination is unremarkable.   Procedure: Right-sided Epley maneuver  Anesthesia: None Indication: To treat the right BPPV Desciption:  The patient is first placed in a right-sided Dix-Hallpike position. After the vertigo has subsided, the head is gradually rotated from the right to the left, completing a 180 turn. The patient is then returned to the upright position. The patient tolerated the procedure well without any difficulty.    Procedure: Right ear cerumen disimpaction Anesthesia: None Description: Under the operating microscope, the cerumen is carefully removed with a combination of cerumen currette, alligator forceps, and suction catheters.  After the cerumen is removed, the TMs are noted to be normal.  No mass, erythema, or lesions. The patient tolerated the procedure well.    Assessment: 1.  Recurrent dizziness, secondary to right benign paroxysmal positional  vertigo.  Her right Dix-Hallpike maneuver is positive. 2.   Referred right otalgia, likely secondary to musculoskeletal causes. 3.  Incidental finding of right ear cerumen impaction.  After the cerumen disimpaction procedure, both tympanic membranes and middle ear spaces are noted to be normal. 4.  Subjectively stable bilateral sensorineural hearing loss.  Plan: 1.  An Epley maneuver was performed in the office for the right-sided BPPV. The procedure was successful in repositioning the otoconia, as evidenced by the patient's report of spinning during the maneuver, which subsequently resolved. 2.  Counselled to avoid lying completely flat for the next 48 hours, advising sleeping with extra pillows or in a recliner. Also advised to avoid strenuous activities, jumping, or sudden head shaking for the next two days to allow the inner ear crystals to settle. 3.  Explained the nature and pathophysiology of BPPV, including the dislodgement of inner ear crystals, and that the cause is often idiopathic but can be related to trauma or viral infections. 4.  NSAIDs as needed to treat the referred otalgia. 5.  Follow-up appointment scheduled in one month to reassess the vertigo and determine if a repeat Epley maneuver is necessary.

## 2024-02-13 NOTE — Progress Notes (Signed)
 The patient attended a screening event on 11/29/2023, where her blood pressure was not measured, and her non-fasting blood glucose was 104 mg/dl. During the event, the patient reported that she does not smoke, the pt did not indicate any insurance, she is established with a primary care provider Florene Sharps), and dshe does not have any SDOH needs.  A chart review confirmed that the patient has an active PCP, Dr. Alberta Sharps [facility]. Since the screening event the pt has had an office visit with her PCP on 12/25/2023. She does not have any upcoming visits with her PCP at this time. The pt is currently being supported by her care team. Chart review further indicates that the pt has Medicare as her health insurance. The pt does not have any SDOH needs at this time. The patient demonstrates consistent engagement in her care.  At this time, no additional support from the [facility] Team is indicated.

## 2024-02-24 DIAGNOSIS — H35351 Cystoid macular degeneration, right eye: Secondary | ICD-10-CM | POA: Diagnosis not present

## 2024-02-24 DIAGNOSIS — H5010 Unspecified exotropia: Secondary | ICD-10-CM | POA: Diagnosis not present

## 2024-02-24 DIAGNOSIS — H04123 Dry eye syndrome of bilateral lacrimal glands: Secondary | ICD-10-CM | POA: Diagnosis not present

## 2024-02-24 DIAGNOSIS — H43823 Vitreomacular adhesion, bilateral: Secondary | ICD-10-CM | POA: Diagnosis not present

## 2024-02-24 DIAGNOSIS — H4089 Other specified glaucoma: Secondary | ICD-10-CM | POA: Diagnosis not present

## 2024-02-24 DIAGNOSIS — H26493 Other secondary cataract, bilateral: Secondary | ICD-10-CM | POA: Diagnosis not present

## 2024-02-24 DIAGNOSIS — H33103 Unspecified retinoschisis, bilateral: Secondary | ICD-10-CM | POA: Diagnosis not present

## 2024-03-03 ENCOUNTER — Other Ambulatory Visit: Payer: Self-pay

## 2024-03-09 DIAGNOSIS — M810 Age-related osteoporosis without current pathological fracture: Secondary | ICD-10-CM | POA: Diagnosis not present

## 2024-03-15 DIAGNOSIS — M79641 Pain in right hand: Secondary | ICD-10-CM | POA: Diagnosis not present

## 2024-03-15 DIAGNOSIS — M79642 Pain in left hand: Secondary | ICD-10-CM | POA: Diagnosis not present

## 2024-03-15 DIAGNOSIS — M2550 Pain in unspecified joint: Secondary | ICD-10-CM | POA: Diagnosis not present

## 2024-03-15 DIAGNOSIS — R202 Paresthesia of skin: Secondary | ICD-10-CM | POA: Diagnosis not present

## 2024-03-16 DIAGNOSIS — H43811 Vitreous degeneration, right eye: Secondary | ICD-10-CM | POA: Diagnosis not present

## 2024-03-16 DIAGNOSIS — H401134 Primary open-angle glaucoma, bilateral, indeterminate stage: Secondary | ICD-10-CM | POA: Diagnosis not present

## 2024-03-16 DIAGNOSIS — H33193 Other retinoschisis and retinal cysts, bilateral: Secondary | ICD-10-CM | POA: Diagnosis not present

## 2024-03-16 DIAGNOSIS — H43823 Vitreomacular adhesion, bilateral: Secondary | ICD-10-CM | POA: Diagnosis not present

## 2024-03-17 ENCOUNTER — Encounter (INDEPENDENT_AMBULATORY_CARE_PROVIDER_SITE_OTHER): Payer: Self-pay | Admitting: Otolaryngology

## 2024-03-17 ENCOUNTER — Ambulatory Visit (INDEPENDENT_AMBULATORY_CARE_PROVIDER_SITE_OTHER): Admitting: Otolaryngology

## 2024-03-17 VITALS — BP 155/78 | HR 80

## 2024-03-17 DIAGNOSIS — H903 Sensorineural hearing loss, bilateral: Secondary | ICD-10-CM

## 2024-03-17 DIAGNOSIS — R42 Dizziness and giddiness: Secondary | ICD-10-CM | POA: Diagnosis not present

## 2024-03-18 NOTE — Progress Notes (Signed)
 Patient ID: NOVALYN LAJARA, female   DOB: 20-Feb-1946, 78 y.o.   MRN: [id number]  Follow-up: Recurrent dizziness, bilateral hearing loss  HPI: The patient is a 78 year old female who returns today for her follow-up evaluation.  She was last seen in June 2025.  At that time, she was complaining of recurrent dizziness.  She was noted to have positive right Dix-Hallpike maneuver.  She was treated with the right Epley maneuver.  The patient also has a history of bilateral mild sensorineural hearing loss.  The patient returns today reporting significant improvement in her dizziness.  She has not noted any spinning vertigo.  She has occasional mild balance difficulty.  She denies any change in her hearing.  Exam: General: Communicates without difficulty, well nourished, no acute distress. Head: Normocephalic, no evidence injury, no tenderness, facial buttresses intact without stepoff. Face/sinus: No tenderness to palpation and percussion. Facial movement is normal and symmetric. Eyes: PERRL, EOMI. No scleral icterus, conjunctivae clear. Neuro: CN II exam reveals vision grossly intact.  No nystagmus at any point of gaze. Ears: Auricles well formed without lesions.  Ear canals are intact without mass or lesion.  No erythema or edema is appreciated.  The TMs are intact without fluid. Nose: External evaluation reveals normal support and skin without lesions.  Dorsum is intact.  Anterior rhinoscopy reveals congested mucosa over anterior aspect of inferior turbinates and intact septum.  No purulence noted. Oral:  Oral cavity and oropharynx are intact, symmetric, without erythema or edema.  Mucosa is moist without lesions. Neck: Full range of motion without pain.  There is no significant lymphadenopathy.  No masses palpable.  Thyroid  bed within normal limits to palpation.  Parotid glands and submandibular glands equal bilaterally without mass.  Trachea is midline. Neuro:  CN 2-12 grossly intact. Vestibular: No nystagmus at  any point of gaze. Dix Hallpike negative. Vestibular: There is no nystagmus with pneumatic pressure on either tympanic membrane or Valsalva. The cerebellar examination is unremarkable.   Assessment: 1.  The patient's recurrent dizziness is currently under controlled, after she was treated with right Epley maneuver. 2.  Subjectively stable bilateral mild sensorineural hearing loss. 3.  Her ear canals, tympanic membranes, and middle ear spaces are normal.  Plan: 1.  The physical exam findings are reviewed with the patient. 2.  The patient is a candidate for hearing amplification. 3.  The patient will return for reevaluation in 6 months, sooner if needed.

## 2024-04-07 DIAGNOSIS — E7800 Pure hypercholesterolemia, unspecified: Secondary | ICD-10-CM | POA: Diagnosis not present

## 2024-04-07 DIAGNOSIS — R7301 Impaired fasting glucose: Secondary | ICD-10-CM | POA: Diagnosis not present

## 2024-04-07 DIAGNOSIS — E559 Vitamin D deficiency, unspecified: Secondary | ICD-10-CM | POA: Diagnosis not present

## 2024-04-07 DIAGNOSIS — E1165 Type 2 diabetes mellitus with hyperglycemia: Secondary | ICD-10-CM | POA: Diagnosis not present

## 2024-04-07 DIAGNOSIS — E039 Hypothyroidism, unspecified: Secondary | ICD-10-CM | POA: Diagnosis not present

## 2024-04-13 DIAGNOSIS — N958 Other specified menopausal and perimenopausal disorders: Secondary | ICD-10-CM | POA: Diagnosis not present

## 2024-04-13 DIAGNOSIS — M8588 Other specified disorders of bone density and structure, other site: Secondary | ICD-10-CM | POA: Diagnosis not present

## 2024-04-14 DIAGNOSIS — E236 Other disorders of pituitary gland: Secondary | ICD-10-CM | POA: Diagnosis not present

## 2024-04-14 DIAGNOSIS — E039 Hypothyroidism, unspecified: Secondary | ICD-10-CM | POA: Diagnosis not present

## 2024-04-14 DIAGNOSIS — E7800 Pure hypercholesterolemia, unspecified: Secondary | ICD-10-CM | POA: Diagnosis not present

## 2024-04-14 DIAGNOSIS — R7301 Impaired fasting glucose: Secondary | ICD-10-CM | POA: Diagnosis not present

## 2024-04-14 DIAGNOSIS — E559 Vitamin D deficiency, unspecified: Secondary | ICD-10-CM | POA: Diagnosis not present

## 2024-04-14 DIAGNOSIS — E1165 Type 2 diabetes mellitus with hyperglycemia: Secondary | ICD-10-CM | POA: Diagnosis not present

## 2024-04-14 DIAGNOSIS — I10 Essential (primary) hypertension: Secondary | ICD-10-CM | POA: Diagnosis not present

## 2024-04-14 DIAGNOSIS — M810 Age-related osteoporosis without current pathological fracture: Secondary | ICD-10-CM | POA: Diagnosis not present

## 2024-04-15 DIAGNOSIS — H401121 Primary open-angle glaucoma, left eye, mild stage: Secondary | ICD-10-CM | POA: Diagnosis not present

## 2024-04-15 DIAGNOSIS — H401113 Primary open-angle glaucoma, right eye, severe stage: Secondary | ICD-10-CM | POA: Diagnosis not present

## 2024-04-21 ENCOUNTER — Other Ambulatory Visit: Payer: Self-pay

## 2024-04-21 ENCOUNTER — Other Ambulatory Visit (HOSPITAL_BASED_OUTPATIENT_CLINIC_OR_DEPARTMENT_OTHER): Payer: Self-pay

## 2024-04-23 ENCOUNTER — Other Ambulatory Visit (HOSPITAL_BASED_OUTPATIENT_CLINIC_OR_DEPARTMENT_OTHER): Payer: Self-pay

## 2024-04-26 ENCOUNTER — Other Ambulatory Visit (HOSPITAL_BASED_OUTPATIENT_CLINIC_OR_DEPARTMENT_OTHER): Payer: Self-pay

## 2024-05-03 DIAGNOSIS — N6459 Other signs and symptoms in breast: Secondary | ICD-10-CM | POA: Diagnosis not present

## 2024-05-10 DIAGNOSIS — N6315 Unspecified lump in the right breast, overlapping quadrants: Secondary | ICD-10-CM | POA: Diagnosis not present

## 2024-05-10 DIAGNOSIS — R928 Other abnormal and inconclusive findings on diagnostic imaging of breast: Secondary | ICD-10-CM | POA: Diagnosis not present

## 2024-05-10 DIAGNOSIS — R92323 Mammographic fibroglandular density, bilateral breasts: Secondary | ICD-10-CM | POA: Diagnosis not present

## 2024-05-18 ENCOUNTER — Other Ambulatory Visit (HOSPITAL_BASED_OUTPATIENT_CLINIC_OR_DEPARTMENT_OTHER): Payer: Self-pay

## 2024-05-18 DIAGNOSIS — H4089 Other specified glaucoma: Secondary | ICD-10-CM | POA: Diagnosis not present

## 2024-05-18 MED ORDER — RHOPRESSA 0.02 % OP SOLN
1.0000 [drp] | Freq: Every evening | OPHTHALMIC | 12 refills | Status: AC
  Filled 2024-05-18: qty 2.5, 30d supply, fill #0
  Filled 2024-07-14: qty 2.5, 30d supply, fill #1

## 2024-05-18 MED ORDER — LUMIGAN 0.01 % OP SOLN
1.0000 [drp] | Freq: Every day | OPHTHALMIC | 11 refills | Status: AC
  Filled 2024-05-18: qty 5, 100d supply, fill #0
  Filled 2024-06-21: qty 5, 50d supply, fill #0
  Filled 2024-07-14: qty 5, 50d supply, fill #1

## 2024-05-18 MED ORDER — SIMBRINZA 1-0.2 % OP SUSP
1.0000 [drp] | Freq: Two times a day (BID) | OPHTHALMIC | 12 refills | Status: AC
  Filled 2024-05-18: qty 8, 80d supply, fill #0
  Filled 2024-08-02: qty 8, 40d supply, fill #1
  Filled 2024-09-10: qty 8, 40d supply, fill #2
  Filled 2024-09-10: qty 8, 80d supply, fill #2

## 2024-05-19 ENCOUNTER — Other Ambulatory Visit (HOSPITAL_BASED_OUTPATIENT_CLINIC_OR_DEPARTMENT_OTHER): Payer: Self-pay

## 2024-05-31 DIAGNOSIS — J3081 Allergic rhinitis due to animal (cat) (dog) hair and dander: Secondary | ICD-10-CM | POA: Diagnosis not present

## 2024-05-31 DIAGNOSIS — J4530 Mild persistent asthma, uncomplicated: Secondary | ICD-10-CM | POA: Diagnosis not present

## 2024-05-31 DIAGNOSIS — H1045 Other chronic allergic conjunctivitis: Secondary | ICD-10-CM | POA: Diagnosis not present

## 2024-06-16 ENCOUNTER — Other Ambulatory Visit: Payer: Self-pay | Admitting: Obstetrics and Gynecology

## 2024-06-16 DIAGNOSIS — Z6827 Body mass index (BMI) 27.0-27.9, adult: Secondary | ICD-10-CM | POA: Diagnosis not present

## 2024-06-16 DIAGNOSIS — E041 Nontoxic single thyroid nodule: Secondary | ICD-10-CM

## 2024-06-16 DIAGNOSIS — Z01419 Encounter for gynecological examination (general) (routine) without abnormal findings: Secondary | ICD-10-CM | POA: Diagnosis not present

## 2024-06-21 ENCOUNTER — Other Ambulatory Visit: Payer: Self-pay

## 2024-06-21 ENCOUNTER — Other Ambulatory Visit (HOSPITAL_BASED_OUTPATIENT_CLINIC_OR_DEPARTMENT_OTHER): Payer: Self-pay

## 2024-06-23 ENCOUNTER — Ambulatory Visit
Admission: RE | Admit: 2024-06-23 | Discharge: 2024-06-23 | Disposition: A | Discharge status: Home / Self Care | Source: Ambulatory Visit | Attending: Obstetrics and Gynecology | Admitting: Obstetrics and Gynecology

## 2024-06-23 DIAGNOSIS — E041 Nontoxic single thyroid nodule: Secondary | ICD-10-CM | POA: Diagnosis not present

## 2024-07-08 ENCOUNTER — Other Ambulatory Visit (HOSPITAL_BASED_OUTPATIENT_CLINIC_OR_DEPARTMENT_OTHER): Payer: Self-pay

## 2024-07-08 DIAGNOSIS — E113213 Type 2 diabetes mellitus with mild nonproliferative diabetic retinopathy with macular edema, bilateral: Secondary | ICD-10-CM | POA: Diagnosis not present

## 2024-07-08 DIAGNOSIS — E782 Mixed hyperlipidemia: Secondary | ICD-10-CM | POA: Diagnosis not present

## 2024-07-08 DIAGNOSIS — I10 Essential (primary) hypertension: Secondary | ICD-10-CM | POA: Diagnosis not present

## 2024-07-08 MED ORDER — LOSARTAN POTASSIUM 25 MG PO TABS
25.0000 mg | ORAL_TABLET | Freq: Every day | ORAL | 1 refills | Status: AC
  Filled 2024-07-08 – 2024-07-12 (×2): qty 90, 90d supply, fill #0

## 2024-07-12 ENCOUNTER — Other Ambulatory Visit (HOSPITAL_BASED_OUTPATIENT_CLINIC_OR_DEPARTMENT_OTHER): Payer: Self-pay

## 2024-07-14 ENCOUNTER — Other Ambulatory Visit (HOSPITAL_BASED_OUTPATIENT_CLINIC_OR_DEPARTMENT_OTHER): Payer: Self-pay

## 2024-07-14 ENCOUNTER — Other Ambulatory Visit: Payer: Self-pay

## 2024-07-14 DIAGNOSIS — E559 Vitamin D deficiency, unspecified: Secondary | ICD-10-CM | POA: Diagnosis not present

## 2024-07-14 DIAGNOSIS — E039 Hypothyroidism, unspecified: Secondary | ICD-10-CM | POA: Diagnosis not present

## 2024-07-14 DIAGNOSIS — R7301 Impaired fasting glucose: Secondary | ICD-10-CM | POA: Diagnosis not present

## 2024-07-14 DIAGNOSIS — E1165 Type 2 diabetes mellitus with hyperglycemia: Secondary | ICD-10-CM | POA: Diagnosis not present

## 2024-07-16 ENCOUNTER — Other Ambulatory Visit (HOSPITAL_BASED_OUTPATIENT_CLINIC_OR_DEPARTMENT_OTHER): Payer: Self-pay

## 2024-09-10 ENCOUNTER — Other Ambulatory Visit (HOSPITAL_BASED_OUTPATIENT_CLINIC_OR_DEPARTMENT_OTHER): Payer: Self-pay

## 2024-09-17 ENCOUNTER — Ambulatory Visit (INDEPENDENT_AMBULATORY_CARE_PROVIDER_SITE_OTHER): Admitting: Otolaryngology

## 2024-10-06 ENCOUNTER — Ambulatory Visit (INDEPENDENT_AMBULATORY_CARE_PROVIDER_SITE_OTHER): Admitting: Otolaryngology
